# Patient Record
Sex: Male | Born: 1970
Health system: Southern US, Community
[De-identification: ages and names within clinical notes are randomized; demographics above are authoritative.]

## PROBLEM LIST (undated history)

## (undated) DIAGNOSIS — G4730 Sleep apnea, unspecified: Secondary | ICD-10-CM

## (undated) DIAGNOSIS — F419 Anxiety disorder, unspecified: Secondary | ICD-10-CM

## (undated) DIAGNOSIS — Z87442 Personal history of urinary calculi: Secondary | ICD-10-CM

## (undated) DIAGNOSIS — K219 Gastro-esophageal reflux disease without esophagitis: Secondary | ICD-10-CM

## (undated) DIAGNOSIS — C801 Malignant (primary) neoplasm, unspecified: Secondary | ICD-10-CM

## (undated) HISTORY — PX: WISDOM TOOTH EXTRACTION: SHX21

---

## 2016-10-23 DIAGNOSIS — E785 Hyperlipidemia, unspecified: Secondary | ICD-10-CM | POA: Diagnosis present

## 2018-10-05 ENCOUNTER — Other Ambulatory Visit: Payer: Self-pay

## 2018-10-05 ENCOUNTER — Emergency Department (HOSPITAL_BASED_OUTPATIENT_CLINIC_OR_DEPARTMENT_OTHER): Payer: 59

## 2018-10-05 ENCOUNTER — Encounter (HOSPITAL_BASED_OUTPATIENT_CLINIC_OR_DEPARTMENT_OTHER): Payer: Self-pay | Admitting: Emergency Medicine

## 2018-10-05 ENCOUNTER — Emergency Department (HOSPITAL_BASED_OUTPATIENT_CLINIC_OR_DEPARTMENT_OTHER)
Admission: EM | Admit: 2018-10-05 | Discharge: 2018-10-05 | Disposition: A | Discharge status: Home / Self Care | Payer: 59 | Attending: Emergency Medicine | Admitting: Emergency Medicine

## 2018-10-05 DIAGNOSIS — N201 Calculus of ureter: Secondary | ICD-10-CM | POA: Insufficient documentation

## 2018-10-05 DIAGNOSIS — R300 Dysuria: Secondary | ICD-10-CM | POA: Diagnosis present

## 2018-10-05 LAB — COMPREHENSIVE METABOLIC PANEL
ALT: 18 U/L (ref 0–44)
AST: 26 U/L (ref 15–41)
Albumin: 4.1 g/dL (ref 3.5–5.0)
Alkaline Phosphatase: 69 U/L (ref 38–126)
Anion gap: 7 (ref 5–15)
BUN: 16 mg/dL (ref 6–20)
CHLORIDE: 106 mmol/L (ref 98–111)
CO2: 25 mmol/L (ref 22–32)
Calcium: 8.9 mg/dL (ref 8.9–10.3)
Creatinine, Ser: 0.86 mg/dL (ref 0.61–1.24)
GFR calc Af Amer: >60 mL/min (ref >60)
GFR calc non Af Amer: >60 mL/min (ref >60)
Glucose, Bld: 119 mg/dL — ABNORMAL HIGH (ref 70–99)
Potassium: 3.2 mmol/L — ABNORMAL LOW (ref 3.5–5.1)
Sodium: 138 mmol/L (ref 135–145)
TOTAL PROTEIN: 7.1 g/dL (ref 6.5–8.1)
Total Bilirubin: 0.5 mg/dL (ref 0.3–1.2)

## 2018-10-05 LAB — CBC WITH DIFFERENTIAL/PLATELET
Abs Immature Granulocytes: 0.03 10*3/uL (ref 0.00–0.07)
BASOS ABS: 0 10*3/uL (ref 0.0–0.1)
Basophils Relative: 0 %
Eosinophils Absolute: 0.1 10*3/uL (ref 0.0–0.5)
Eosinophils Relative: 1 %
HCT: 44.4 % (ref 39.0–52.0)
Hemoglobin: 14 g/dL (ref 13.0–17.0)
Immature Granulocytes: 0 %
Lymphocytes Relative: 16 %
Lymphs Abs: 1.2 10*3/uL (ref 0.7–4.0)
MCH: 29.9 pg (ref 26.0–34.0)
MCHC: 31.5 g/dL (ref 30.0–36.0)
MCV: 94.7 fL (ref 80.0–100.0)
Monocytes Absolute: 0.9 10*3/uL (ref 0.1–1.0)
Monocytes Relative: 12 %
Neutro Abs: 5.1 10*3/uL (ref 1.7–7.7)
Neutrophils Relative %: 71 %
PLATELETS: 238 10*3/uL (ref 150–400)
RBC: 4.69 MIL/uL (ref 4.22–5.81)
RDW: 12.6 % (ref 11.5–15.5)
WBC: 7.4 10*3/uL (ref 4.0–10.5)
nRBC: 0 % (ref 0.0–0.2)

## 2018-10-05 LAB — URINALYSIS, ROUTINE W REFLEX MICROSCOPIC
Bilirubin Urine: NEGATIVE
Glucose, UA: NEGATIVE mg/dL
KETONES UR: NEGATIVE mg/dL
Leukocytes,Ua: NEGATIVE
Nitrite: NEGATIVE
PROTEIN: NEGATIVE mg/dL
Specific Gravity, Urine: 1.01 (ref 1.005–1.030)
pH: 5.5 (ref 5.0–8.0)

## 2018-10-05 LAB — URINALYSIS, MICROSCOPIC (REFLEX)

## 2018-10-05 MED ORDER — KETOROLAC TROMETHAMINE 10 MG PO TABS: 10.0000 mg | ORAL_TABLET | Freq: Four times a day (QID) | ORAL | 0 refills | Status: DC | PRN

## 2018-10-05 MED ORDER — KETOROLAC TROMETHAMINE 30 MG/ML IJ SOLN
30.0000 mg | Freq: Once | INTRAMUSCULAR | Status: AC
  Administered 2018-10-05: 30 mg via INTRAVENOUS
  Filled 2018-10-05: qty 1

## 2018-10-05 NOTE — ED Notes (Signed)
Patient transported to CT 

## 2018-10-05 NOTE — ED Notes (Signed)
ED Provider at bedside. 

## 2018-10-05 NOTE — Discharge Instructions (Signed)
You have been seen in the Emergency Department (ED) today for pain that we believe based on your workup, is caused by kidney stones.  As we have discussed, please drink plenty of fluids.  Please make a follow up appointment with the physician(s) listed elsewhere in this documentation. ° °You may take pain medication as needed but ONLY as prescribed.  Please also take your prescribed Flomax daily.  We also recommend that you take over-the-counter ibuprofen regularly according to label instructions over the next 5 days.  Take it with meals to minimize stomach discomfort. ° °Please see your doctor as soon as possible as stones may take 1-3 weeks to pass and you may require additional care or medications. ° °Do not drink alcohol, drive or participate in any other potentially dangerous activities while taking opiate pain medication as it may make you sleepy. Do not take this medication with any other sedating medications, either prescription or over-the-counter. If you were prescribed Percocet or Vicodin, do not take these with acetaminophen (Tylenol) as it is already contained within these medications. °  °Return to the Emergency Department (ED) or call your doctor if you have any worsening pain, fever, painful urination, are unable to urinate, or develop other symptoms that concern you. ° ° ° °Kidney Stones °Kidney stones (urolithiasis) are deposits that form inside your kidneys. The intense pain is caused by the stone moving through the urinary tract. When the stone moves, the ureter goes into spasm around the stone. The stone is usually passed in the urine.  °CAUSES  °A disorder that makes certain neck glands produce too much parathyroid hormone (primary hyperparathyroidism). °A buildup of uric acid crystals, similar to gout in your joints. °Narrowing (stricture) of the ureter. °A kidney obstruction present at birth (congenital obstruction). °Previous surgery on the kidney or ureters. °Numerous kidney  infections. °SYMPTOMS  °Feeling sick to your stomach (nauseous). °Throwing up (vomiting). °Blood in the urine (hematuria). °Pain that usually spreads (radiates) to the groin. °Frequency or urgency of urination. °DIAGNOSIS  °Taking a history and physical exam. °Blood or urine tests. °CT scan. °Occasionally, an examination of the inside of the urinary bladder (cystoscopy) is performed. °TREATMENT  °Observation. °Increasing your fluid intake. °Extracorporeal shock wave lithotripsy--This is a noninvasive procedure that uses shock waves to break up kidney stones. °Surgery may be needed if you have severe pain or persistent obstruction. There are various surgical procedures. Most of the procedures are performed with the use of small instruments. Only small incisions are needed to accommodate these instruments, so recovery time is minimized. °The size, location, and chemical composition are all important variables that will determine the proper choice of action for you. Talk to your health care provider to better understand your situation so that you will minimize the risk of injury to yourself and your kidney.  °HOME CARE INSTRUCTIONS  °Drink enough water and fluids to keep your urine clear or pale yellow. This will help you to pass the stone or stone fragments. °Strain all urine through the provided strainer. Keep all particulate matter and stones for your health care provider to see. The stone causing the pain may be as small as a grain of salt. It is very important to use the strainer each and every time you pass your urine. The collection of your stone will allow your health care provider to analyze it and verify that a stone has actually passed. The stone analysis will often identify what you can do to reduce the incidence of   recurrences. °Only take over-the-counter or prescription medicines for pain, discomfort, or fever as directed by your health care provider. °Keep all follow-up visits as told by your health care  provider. This is important. °Get follow-up X-rays if required. The absence of pain does not always mean that the stone has passed. It may have only stopped moving. If the urine remains completely obstructed, it can cause loss of kidney function or even complete destruction of the kidney. It is your responsibility to make sure X-rays and follow-ups are completed. Ultrasounds of the kidney can show blockages and the status of the kidney. Ultrasounds are not associated with any radiation and can be performed easily in a matter of minutes. °Make changes to your daily diet as told by your health care provider. You may be told to: °Limit the amount of salt that you eat. °Eat 5 or more servings of fruits and vegetables each day. °Limit the amount of meat, poultry, fish, and eggs that you eat. °Collect a 24-hour urine sample as told by your health care provider. You may need to collect another urine sample every 6-12 months. °SEEK MEDICAL CARE IF: °You experience pain that is progressive and unresponsive to any pain medicine you have been prescribed. °SEEK IMMEDIATE MEDICAL CARE IF:  °Pain cannot be controlled with the prescribed medicine. °You have a fever or shaking chills. °The severity or intensity of pain increases over 18 hours and is not relieved by pain medicine. °You develop a new onset of abdominal pain. °You feel faint or pass out. °You are unable to urinate. °  °This information is not intended to replace advice given to you by your health care provider. Make sure you discuss any questions you have with your health care provider. °  °Document Released: 07/23/2005 Document Revised: 04/13/2015 Document Reviewed: 12/24/2012 °Elsevier Interactive Patient Education ©2016 [employer] ° ° °

## 2018-10-05 NOTE — ED Provider Notes (Signed)
Emergency Department Provider Note   I have reviewed the triage vital signs and the nursing notes.   HISTORY  Chief Complaint Flank Pain   HPI Jimmy [name] is a 48 y.o. male with past medical history of kidney stones presents to the emergency department with dysuria, hematuria, intermittent flank pain for the past 4 weeks.  The patient has a remote history of kidney stone which did not require intervention.  He initially developed some blood in the urine with mild left flank pain 4 weeks ago.  He went to urgent care and was treated empirically for kidney stone.  He returned to his PCP who referred him to a urologist.  Patient states that the urologist performed an x-ray and ran a urine test which did not show blood or an obvious stone.  Over the past several days he is developed intermittent, severe left flank pain with continued hematuria.  No fevers or chills.  Patient reports some urine hesitancy with pain but then urinates normally when he is not having pain.   History reviewed. No pertinent past medical history.  There are no active problems to display for this patient.   Allergies Patient has no known allergies.  History reviewed. No pertinent family history.  Social History Social History   Tobacco Use  . Smoking status: Not on file  Substance Use Topics  . Alcohol use: Not on file  . Drug use: Not on file    Review of Systems  Constitutional: No fever/chills Eyes: No visual changes. ENT: No sore throat. Cardiovascular: Denies chest pain. Respiratory: Denies shortness of breath. Gastrointestinal: No abdominal pain.  No nausea, no vomiting.  No diarrhea.  No constipation. Positive left flank pain.  Genitourinary: Positive for dysuria and hematuria.  Musculoskeletal: Negative for back pain. Skin: Negative for rash. Neurological: Negative for headaches, focal weakness or numbness.  10-point ROS otherwise  negative.  ____________________________________________   PHYSICAL EXAM:  VITAL SIGNS: ED Triage Vitals  Enc Vitals Group     BP 10/05/18 1643 (!) 159/110     Pulse Rate 10/05/18 1643 93     Resp 10/05/18 1643 18     Temp 10/05/18 1643 97.8 F (36.6 C)     Temp Source 10/05/18 1643 Oral     SpO2 10/05/18 1643 97 %     Weight 10/05/18 1642 237 lb (107.5 kg)     Height 10/05/18 1642 6' 1.5" (1.867 m)    Constitutional: Alert and oriented. Well appearing and in no acute distress. Eyes: Conjunctivae are normal.  Head: Atraumatic. Nose: No congestion/rhinnorhea. Mouth/Throat: Mucous membranes are moist.   Neck: No stridor. Cardiovascular: Normal rate, regular rhythm. Good peripheral circulation. Grossly normal heart sounds.   Respiratory: Normal respiratory effort.  No retractions. Lungs CTAB. Gastrointestinal: Soft and nontender. No distention.  Musculoskeletal: No lower extremity tenderness nor edema. No gross deformities of extremities. Neurologic:  Normal speech and language. No gross focal neurologic deficits are appreciated.  Skin:  Skin is warm, dry and intact. No rash noted.  ____________________________________________   LABS (all labs ordered are listed, but only abnormal results are displayed)  Labs Reviewed  COMPREHENSIVE METABOLIC PANEL - Abnormal; Notable for the following components:      Result Value   Potassium 3.2 (*)    Glucose, Bld 119 (*)    All other components within normal limits  URINALYSIS, ROUTINE W REFLEX MICROSCOPIC - Abnormal; Notable for the following components:   Hgb urine dipstick MODERATE (*)  All other components within normal limits  URINALYSIS, MICROSCOPIC (REFLEX) - Abnormal; Notable for the following components:   Bacteria, UA RARE (*)    All other components within normal limits  URINE CULTURE  CBC WITH DIFFERENTIAL/PLATELET   ____________________________________________  RADIOLOGY  Ct Renal Stone Study  Result Date:  10/05/2018 CLINICAL DATA:  48 y/o  M; 1 month of left flank pain. EXAM: CT ABDOMEN AND PELVIS WITHOUT CONTRAST TECHNIQUE: Multidetector CT imaging of the abdomen and pelvis was performed following the standard protocol without IV contrast. COMPARISON:  None. FINDINGS: Lower chest: No acute abnormality. Hepatobiliary: Liver segment 4B subcentimeter lucency, likely cyst. Otherwise no focal liver abnormality is seen. No gallstones, gallbladder wall thickening, or biliary dilatation. Pancreas: Unremarkable. No pancreatic ductal dilatation or surrounding inflammatory changes. Spleen: Normal in size without focal abnormality. Adrenals/Urinary Tract: Normal adrenal glands. No focal kidney lesion identified. Right kidney upper pole 3 mm nonobstructing stone. Mild left hydronephrosis and perinephric stranding with a 3 mm stone at the left ureterovesicular junction and adjacent punctate stone (series 2, image 83). Stomach/Bowel: Stomach is within normal limits. Appendix appears normal. No evidence of bowel wall thickening, distention, or inflammatory changes. Vascular/Lymphatic: No significant vascular findings are present. No enlarged abdominal or pelvic lymph nodes. Reproductive: Prostate is unremarkable. Other: Small paraumbilical hernia containing fat. Musculoskeletal: No fracture is seen. Mild lumbar levocurvature with apex at L3. IMPRESSION: 1. Mild left hydronephrosis and perinephric stranding with a 3 mm stone at the left ureterovesicular junction and adjacent punctate stone. 2. Right kidney upper pole 3 mm nonobstructing stone. 3. Small paraumbilical hernia containing fat. Electronically Signed   By: Kristine Garbe M.D.   On: 10/05/2018 17:35    ____________________________________________   PROCEDURES  Procedure(s) performed:   Procedures  None  ____________________________________________   INITIAL IMPRESSION / ASSESSMENT AND PLAN / ED COURSE  Pertinent labs & imaging results that were  available during my care of the patient were reviewed by me and considered in my medical decision making (see chart for details).  Patient presents to the emergency department for evaluation of intermittent left flank pain with dysuria and hematuria.  Plan for CT renal for further evaluation and to rule out kidney stone.  Very low suspicion for vascular etiology of the patient's symptoms.  He does have some associated dysuria which may be related to his hematuria.  Will obtain urine analysis along with screening labs and send urine for culture.  CT reviewed.  Patient with 3 mm stone at the UVJ with hydronephrosis.  Urinalysis not concerning for infection developing urosepsis.  Patient's pain is well controlled.  He is not having pain at the fat-containing periumbilical hernia identified on CT.  Advised the patient start taking the Flomax which he was prescribed earlier.  Patient was also given Percocet which he can take for breakthrough symptoms.  I have added on Toradol and given the name for local urology for follow-up.  ____________________________________________  FINAL CLINICAL IMPRESSION(S) / ED DIAGNOSES  Final diagnoses:  Ureterolithiasis     MEDICATIONS GIVEN DURING THIS VISIT:  Medications  ketorolac (TORADOL) 30 MG/ML injection 30 mg (30 mg Intravenous Given 10/05/18 1831)     NEW OUTPATIENT MEDICATIONS STARTED DURING THIS VISIT:  Discharge Medication List as of 10/05/2018  6:19 PM    START taking these medications   Details  ketorolac (TORADOL) 10 MG tablet Take 1 tablet (10 mg total) by mouth every 6 (six) hours as needed., Starting Sun 10/05/2018, Print  Note:  This document was prepared using Dragon voice recognition software and may include unintentional dictation errors.  Nanda Quinton, MD Emergency Medicine    Schwanda Zima, [facility], MD 10/06/18 [phone]

## 2018-10-05 NOTE — ED Triage Notes (Signed)
Left flank pain x 1 month.  History of kidney stones.  Reports worsening pain today.  Denies nausea, vomiting.  C/o dysuria.

## 2018-10-07 LAB — URINE CULTURE: Culture: NO GROWTH

## 2019-09-10 ENCOUNTER — Other Ambulatory Visit: Payer: Self-pay | Admitting: Urology

## 2019-09-10 DIAGNOSIS — N201 Calculus of ureter: Secondary | ICD-10-CM

## 2019-09-15 NOTE — H&P (Signed)
HPI: Jimmy [name] is a 49 year-old male with a right ureteral stone.  The problem is on the right side.   Claris returns today in f/u for an 44mm stone on the right adjacent to L4 that was seen on a CT for hematuria on 1/27. He was initially seen for the hematuria on 08/11/19. A culture then was negative. A KUB today shows no progression for the stone. He has some dull left sided pain but no right flank pain. He has no further gross hematuria. He has some microhematuria and pyuria. He did have mild dysuria this morning. He has no history of UTI's.      ALLERGIES: None   MEDICATIONS: Amitriptyline Hcl 50 mg tablet  Sertraline Hcl 100 mg tablet  Testosterone Cypionate     GU PSH: Locm 300-399Mg /Ml Iodine,1Ml - 09/02/2019       PSH Notes: Encounter for contraceptive planning, Surgery Of Male Genitalia Vasectomy   NON-GU PSH: None   GU PMH: Gross hematuria, He has had intermittent gross hematuria with a history of stones and some left flank pain this week.. He had some bladder wall thickening on the CT in 3/20 and a family history of bladder cancer. I will get a CT hematuria study and consider cystoscopy if needed based on CT. He would like to use Nitrous. - 08/20/2019 Renal calculus, He had a 20mm RUP stone on the prior study. He never saw the left UVJ stone pass. - 08/20/2019 History of urolithiasis, Nephrolithiasis - 2014 Premature ejaculation, Premature ejaculation - 2014      PMH Notes:  1898-08-06 00:00:00 - Note: Normal Routine History And Physical Adult   NON-GU PMH: Bacteriuria, He has mild pyuria and bacteriuria on UA today. I will get a culture. - 08/20/2019 Anxiety Sleep Apnea    FAMILY HISTORY: Bladder Cancer - Father Family Health Status Number - Runs In Family lymphoma - Mother   SOCIAL HISTORY: Marital Status: Married Preferred Language: English; Race: White Current Smoking Status: Patient has never smoked.   Tobacco Use Assessment Completed: Used Tobacco in last 30  days? Drinks 1 drink per day.  Drinks 2 caffeinated drinks per day.     Notes: Caffeine Use, Alcohol Use, Marital History - Currently Married, Occupation:, Tobacco Use   REVIEW OF SYSTEMS:    GU Review Male:   Patient denies frequent urination, hard to postpone urination, burning/ pain with urination, get up at night to urinate, leakage of urine, stream starts and stops, trouble starting your stream, have to strain to urinate , erection problems, and penile pain.  Gastrointestinal (Upper):   Patient denies nausea, vomiting, and indigestion/ heartburn.  Gastrointestinal (Lower):   Patient denies diarrhea and constipation.  Constitutional:   Patient denies fever, night sweats, weight loss, and fatigue.  Skin:   Patient denies skin rash/ lesion and itching.  Eyes:   Patient denies blurred vision and double vision.  Ears/ Nose/ Throat:   Patient denies sore throat and sinus problems.  Hematologic/Lymphatic:   Patient denies swollen glands and easy bruising.  Cardiovascular:   Patient denies leg swelling and chest pains.  Respiratory:   Patient denies cough and shortness of breath.  Endocrine:   Patient denies excessive thirst.  Musculoskeletal:   Patient reports back pain. Patient denies joint pain.  Neurological:   Patient denies dizziness and headaches.  Psychologic:   Patient denies depression and anxiety.   VITAL SIGNS:    BP 143/90 mmHg  Heart Rate 73 /min  Temperature 98.4 F /  36.8 C   GU PHYSICAL EXAMINATION:    Anus and Perineum: No hemorrhoids. No anal stenosis. No rectal fissure, no anal fissure. No edema, no dimple, no perineal tenderness, no anal tenderness.  Scrotum: No lesions. No edema. No cysts. No warts.  Epididymides: Right: no spermatocele, no masses, no cysts, no tenderness, no induration, no enlargement. Left: no spermatocele, no masses, no cysts, no tenderness, no induration, no enlargement.  Testes: No tenderness, no swelling, no enlargement left testes. No  tenderness, no swelling, no enlargement right testes. Normal location left testes. Normal location right testes. No mass, no cyst, no varicocele, no hydrocele left testes. No mass, no cyst, no varicocele, no hydrocele right testes.  Urethral Meatus: Normal size. No lesion, no wart, no discharge, no polyp. Normal location.  Penis: Circumcised, no warts, no cracks. No dorsal Peyronie's plaques, no left corporal Peyronie's plaques, no right corporal Peyronie's plaques, no scarring, no warts. No balanitis, no meatal stenosis.  Prostate: 40 gram or 2+ size. Left lobe normal consistency, right lobe normal consistency. Symmetrical lobes. No prostate nodule. Left lobe no tenderness, right lobe no tenderness.  Seminal Vesicles: Nonpalpable.  Sphincter Tone: Normal sphincter. No rectal tenderness. No rectal mass.    MULTI-SYSTEM PHYSICAL EXAMINATION:    Constitutional: Well-nourished. No physical deformities. Normally developed. Good grooming.  Neck: Neck symmetrical, not swollen. Normal tracheal position.  Respiratory: No labored breathing, no use of accessory muscles.   Cardiovascular: Normal temperature, normal extremity pulses, no swelling, no varicosities.  Lymphatic: No enlargement of neck, axillae, groin.  Skin: No paleness, no jaundice, no cyanosis. No lesion, no ulcer, no rash.  Neurologic / Psychiatric: Oriented to time, oriented to place, oriented to person. No depression, no anxiety, no agitation.  Gastrointestinal: No mass, no tenderness, no rigidity, non obese abdomen.  Eyes: Normal conjunctivae. Normal eyelids.  Ears, Nose, Mouth, and Throat: Left ear no scars, no lesions, no masses. Right ear no scars, no lesions, no masses. Nose no scars, no lesions, no masses. Normal hearing. Normal lips.  Musculoskeletal: Normal gait and station of head and neck.    PAST DATA REVIEWED:  Source Of History:  Patient  Urine Test Review:   Urinalysis, Urine Culture  X-Ray Review: KUB: Reviewed Films.  Reviewed Report. Discussed With Patient.  C.T. Hematuria: Reviewed Films. Reviewed Report. Discussed With Patient.     04/08/07  PSA  Total PSA 0.39          Urinalysis w/Scope Dipstick Dipstick Cont'd Micro  Color: Yellow Bilirubin: Neg mg/dL WBC/hpf: 10 - 20/hpf  Appearance: Clear Ketones: Neg mg/dL RBC/hpf: 3 - 10/hpf  Specific Gravity: 1.025 Blood: Trace ery/uL Bacteria: Rare (0-9/hpf)  pH: 6.5 Protein: Trace mg/dL Cystals: NS (Not Seen)  Glucose: Neg mg/dL Urobilinogen: 0.2 mg/dL Casts: NS (Not Seen)    Nitrites: Neg Trichomonas: Not Present    Leukocyte Esterase: 1+ leu/uL Mucous: Present      Epithelial Cells: NS (Not Seen)      Yeast: NS (Not Seen)      Sperm: Not Present    ASSESSMENT/PLAN:     ICD-10 Details  1 GU:   Ureteral calculus - N20.1 Undiagnosed New Problem - He has an 60mm stone in the right lower proximal ureter that has not progressed since 1/27. I discussed options and will get him set up for ESWL. I reviewed the risks of ESWL including bleeding, infection, injury to the kidney or adjacent structures, failure to fragment the stone, need for ancillary procedures, thrombotic events, cardiac  arrhythmias and sedation complications.   2   Microscopic hematuria - 99991111 Acute, Uncomplicated - He had microhematuria and pyuria. A repeat urine culture was neg.

## 2019-09-17 ENCOUNTER — Encounter (HOSPITAL_BASED_OUTPATIENT_CLINIC_OR_DEPARTMENT_OTHER): Payer: Self-pay | Admitting: Urology

## 2019-09-17 ENCOUNTER — Encounter (HOSPITAL_BASED_OUTPATIENT_CLINIC_OR_DEPARTMENT_OTHER)
Admission: RE | Disposition: A | Discharge status: Home / Self Care | Payer: Self-pay | Source: Ambulatory Visit | Attending: Urology

## 2019-09-17 ENCOUNTER — Ambulatory Visit (HOSPITAL_COMMUNITY): Payer: 59

## 2019-09-17 ENCOUNTER — Ambulatory Visit (HOSPITAL_BASED_OUTPATIENT_CLINIC_OR_DEPARTMENT_OTHER)
Admission: RE | Admit: 2019-09-17 | Discharge: 2019-09-17 | Disposition: A | Discharge status: Home / Self Care | Payer: 59 | Source: Ambulatory Visit | Attending: Urology | Admitting: Urology

## 2019-09-17 DIAGNOSIS — N201 Calculus of ureter: Secondary | ICD-10-CM | POA: Diagnosis present

## 2019-09-17 DIAGNOSIS — R3129 Other microscopic hematuria: Secondary | ICD-10-CM | POA: Diagnosis not present

## 2019-09-17 HISTORY — PX: EXTRACORPOREAL SHOCK WAVE LITHOTRIPSY: SHX1557

## 2019-09-17 SURGERY — LITHOTRIPSY, ESWL
Anesthesia: LOCAL | Laterality: Right

## 2019-09-17 MED ORDER — TAMSULOSIN HCL 0.4 MG PO CAPS: 0.4000 mg | ORAL_CAPSULE | ORAL | 0 refills | Status: DC

## 2019-09-17 MED ORDER — DIAZEPAM 5 MG PO TABS
ORAL_TABLET | ORAL | Status: AC
  Filled 2019-09-17: qty 2

## 2019-09-17 MED ORDER — CIPROFLOXACIN HCL 500 MG PO TABS
500.0000 mg | ORAL_TABLET | ORAL | Status: AC
  Administered 2019-09-17: 500 mg via ORAL
  Filled 2019-09-17: qty 1

## 2019-09-17 MED ORDER — SODIUM CHLORIDE 0.9 % IV SOLN
INTRAVENOUS | Status: DC
  Filled 2019-09-17: qty 1000

## 2019-09-17 MED ORDER — DIAZEPAM 5 MG PO TABS
10.0000 mg | ORAL_TABLET | ORAL | Status: AC
  Administered 2019-09-17: 10 mg via ORAL
  Filled 2019-09-17: qty 2

## 2019-09-17 MED ORDER — DIPHENHYDRAMINE HCL 25 MG PO CAPS
25.0000 mg | ORAL_CAPSULE | ORAL | Status: AC
  Administered 2019-09-17: 25 mg via ORAL
  Filled 2019-09-17: qty 1

## 2019-09-17 MED ORDER — CIPROFLOXACIN HCL 500 MG PO TABS
ORAL_TABLET | ORAL | Status: AC
  Filled 2019-09-17: qty 1

## 2019-09-17 MED ORDER — DIPHENHYDRAMINE HCL 25 MG PO CAPS
ORAL_CAPSULE | ORAL | Status: AC
  Filled 2019-09-17: qty 1

## 2019-09-17 MED ORDER — HYDROCODONE-ACETAMINOPHEN 10-325 MG PO TABS: 1.0000 | ORAL_TABLET | ORAL | 0 refills | Status: DC | PRN

## 2019-09-17 NOTE — Op Note (Signed)
See [facility] OP note scanned into chart. Also because of the size, density, location and other factors that cannot be anticipated I feel this will likely be a staged procedure. This fact supersedes any indication in the scanned [facility] operative note to the contrary.  

## 2019-09-17 NOTE — Discharge Instructions (Signed)
Post Anesthesia Home Care Instructions  Activity: Get plenty of rest for the remainder of the day. A responsible adult should stay with you for 24 hours following the procedure.  For the next 24 hours, DO NOT: -Drive a car -Operate machinery -Drink alcoholic beverages -Take any medication unless instructed by your physician -Make any legal decisions or sign important papers.  Meals: Start with liquid foods such as gelatin or soup. Progress to regular foods as tolerated. Avoid greasy, spicy, heavy foods. If nausea and/or vomiting occur, drink only clear liquids until the nausea and/or vomiting subsides. Call your physician if vomiting continues.  Special Instructions/Symptoms: Your throat may feel dry or sore from the anesthesia or the breathing tube placed in your throat during surgery. If this causes discomfort, gargle with warm salt water. The discomfort should disappear within 24 hours.  If you had a scopolamine patch placed behind your ear for the management of post- operative nausea and/or vomiting:  1. The medication in the patch is effective for 72 hours, after which it should be removed.  Wrap patch in a tissue and discard in the trash. Wash hands thoroughly with soap and water. 2. You may remove the patch earlier than 72 hours if you experience unpleasant side effects which may include dry mouth, dizziness or visual disturbances. 3. Avoid touching the patch. Wash your hands with soap and water after contact with the patch.   Lithotripsy, Care After This sheet gives you information about how to care for yourself after your procedure. Your health care provider may also give you more specific instructions. If you have problems or questions, contact your health care provider. What can I expect after the procedure? After the procedure, it is common to have:  Some blood in your urine. This should only last for a few days.  Soreness in your back, sides, or upper abdomen for a few  days.  Blotches or bruises on your back where the pressure wave entered the skin.  Pain, discomfort, or nausea when pieces (fragments) of the kidney stone move through the tube that carries urine from the kidney to the bladder (ureter). Stone fragments may pass soon after the procedure, but they may continue to pass for up to 4-8 weeks. ? If you have severe pain or nausea, contact your health care provider. This may be caused by a large stone that was not broken up, and this may mean that you need more treatment.  Some pain or discomfort during urination.  Some pain or discomfort in the lower abdomen or (in men) at the base of the penis. Follow these instructions at home: Medicines  Take over-the-counter and prescription medicines only as told by your health care provider.  If you were prescribed an antibiotic medicine, take it as told by your health care provider. Do not stop taking the antibiotic even if you start to feel better.  Do not drive for 24 hours if you were given a medicine to help you relax (sedative).  Do not drive or use heavy machinery while taking prescription pain medicine. Eating and drinking      Drink enough water and fluids to keep your urine clear or pale yellow. This helps any remaining pieces of the stone to pass. It can also help prevent new stones from forming.  Eat plenty of fresh fruits and vegetables.  Follow instructions from your health care provider about eating and drinking restrictions. You may be instructed: ? To reduce how much salt (sodium) you eat   or drink. Check ingredients and nutrition facts on packaged foods and beverages. ? To reduce how much meat you eat.  Eat the recommended amount of calcium for your age and gender. Ask your health care provider how much calcium you should have. General instructions  Get plenty of rest.  Most people can resume normal activities 1-2 days after the procedure. Ask your health care provider what  activities are safe for you.  Your health care provider may direct you to lie in a certain position (postural drainage) and tap firmly (percuss) over your kidney area to help stone fragments pass. Follow instructions as told by your health care provider.  If directed, strain all urine through the strainer that was provided by your health care provider. ? Keep all fragments for your health care provider to see. Any stones that are found may be sent to a medical lab for examination. The stone may be as small as a grain of salt.  Keep all follow-up visits as told by your health care provider. This is important. Contact a health care provider if:  You have pain that is severe or does not get better with medicine.  You have nausea that is severe or does not go away.  You have blood in your urine longer than your health care provider told you to expect.  You have more blood in your urine.  You have pain during urination that does not go away.  You urinate more frequently than usual and this does not go away.  You develop a rash or any other possible signs of an allergic reaction. Get help right away if:  You have severe pain in your back, sides, or upper abdomen.  You have severe pain while urinating.  Your urine is very dark red.  You have blood in your stool (feces).  You cannot pass any urine at all.  You feel a strong urge to urinate after emptying your bladder.  You have a fever or chills.  You develop shortness of breath, difficulty breathing, or chest pain.  You have severe nausea that leads to persistent vomiting.  You faint. Summary  After this procedure, it is common to have some pain, discomfort, or nausea when pieces (fragments) of the kidney stone move through the tube that carries urine from the kidney to the bladder (ureter). If this pain or nausea is severe, however, you should contact your health care provider.  Most people can resume normal activities 1-2  days after the procedure. Ask your health care provider what activities are safe for you.  Drink enough water and fluids to keep your urine clear or pale yellow. This helps any remaining pieces of the stone to pass, and it can help prevent new stones from forming.  If directed, strain your urine and keep all fragments for your health care provider to see. Fragments or stones may be as small as a grain of salt.  Get help right away if you have severe pain in your back, sides, or upper abdomen or have severe pain while urinating. This information is not intended to replace advice given to you by your health care provider. Make sure you discuss any questions you have with your health care provider. Document Revised: 11/03/2018 Document Reviewed: 06/13/2016 Elsevier Patient Education  2020 [employer] Inc.  

## 2021-09-12 ENCOUNTER — Ambulatory Visit: Payer: Self-pay | Admitting: Surgery

## 2021-09-12 DIAGNOSIS — E66811 Obesity, class 1: Secondary | ICD-10-CM | POA: Diagnosis present

## 2021-09-12 DIAGNOSIS — R739 Hyperglycemia, unspecified: Secondary | ICD-10-CM

## 2021-09-12 DIAGNOSIS — K420 Umbilical hernia with obstruction, without gangrene: Secondary | ICD-10-CM | POA: Insufficient documentation

## 2021-09-12 DIAGNOSIS — E669 Obesity, unspecified: Secondary | ICD-10-CM | POA: Diagnosis present

## 2021-09-12 DIAGNOSIS — G4733 Obstructive sleep apnea (adult) (pediatric): Secondary | ICD-10-CM | POA: Insufficient documentation

## 2021-09-13 ENCOUNTER — Other Ambulatory Visit: Payer: Self-pay | Admitting: Surgery

## 2021-09-13 DIAGNOSIS — C187 Malignant neoplasm of sigmoid colon: Secondary | ICD-10-CM

## 2021-09-19 ENCOUNTER — Inpatient Hospital Stay: Admission: RE | Admit: 2021-09-19 | Payer: 59 | Source: Ambulatory Visit

## 2021-09-20 ENCOUNTER — Ambulatory Visit
Admission: RE | Admit: 2021-09-20 | Discharge: 2021-09-20 | Disposition: A | Discharge status: Home / Self Care | Payer: 59 | Source: Ambulatory Visit | Attending: Surgery | Admitting: Surgery

## 2021-09-20 ENCOUNTER — Other Ambulatory Visit: Payer: Self-pay

## 2021-09-20 DIAGNOSIS — C187 Malignant neoplasm of sigmoid colon: Secondary | ICD-10-CM

## 2021-09-20 MED ORDER — IOPAMIDOL (ISOVUE-300) INJECTION 61%
100.0000 mL | Freq: Once | INTRAVENOUS | Status: AC | PRN
  Administered 2021-09-20: 100 mL via INTRAVENOUS

## 2021-10-05 NOTE — Progress Notes (Addendum)
COVID swab appointment: 10/17/21 @ 1345 ? ?COVID Vaccine Completed: no ?Date COVID Vaccine completed: ?Has received booster: ?COVID vaccine manufacturer: [place]  ? ?Date of COVID positive in last 90 days: no ? ?PCP - Cecille Amsterdam, MD ?Cardiologist - n/a ? ?Chest x-ray - CT 09/20/21 Epic ?EKG - n/a ?Stress Test - 2018 ?ECHO - 2018 ?Cardiac Cath - n/a ?Pacemaker/ICD device last checked: n/a ?Spinal Cord Stimulator: n/a ? ?Bowel Prep - liquid diet day before, Miralax, Neomycin, Metronidazole, Ducolax, pt has supplies ? ?Sleep Study - yes, positive ?CPAP - yes, every night  ? ?Fasting Blood Sugar - n/a ?Checks Blood Sugar _____ times a day ? ?Blood Thinner Instructions: n/a ?Aspirin Instructions: ?Last Dose: ? ?Activity level: Can go up a flight of stairs and perform activities of daily living without stopping and without symptoms of chest pain or shortness of breath. ?   ?Anesthesia review:  ? ?Patient denies shortness of breath, fever, cough and chest pain at PAT appointment ? ? ?Patient verbalized understanding of instructions that were given to them at the PAT appointment. Patient was also instructed that they will need to review over the PAT instructions again at home before surgery.  ?

## 2021-10-05 NOTE — Patient Instructions (Addendum)
DUE TO COVID-19 ONLY ONE VISITOR IS ALLOWED TO COME WITH YOU AND STAY IN THE WAITING ROOM ONLY DURING PRE OP AND PROCEDURE.   **NO VISITORS ARE ALLOWED IN THE SHORT STAY AREA OR RECOVERY ROOM!!**  IF YOU WILL BE ADMITTED INTO THE HOSPITAL YOU ARE ALLOWED ONLY TWO SUPPORT PEOPLE DURING VISITATION HOURS ONLY (7 AM -8PM)   The support person(s) must pass our screening, gel in and out, and wear a mask at all times, including in the patients room. Patients must also wear a mask when staff or their support person are in the room. Visitors GUEST BADGE MUST BE WORN VISIBLY  One adult visitor may remain with you overnight and MUST be in the room by 8 P.M.  No visitors under the age of 56. Any visitor under the age of 73 must be accompanied by an adult.    COVID SWAB TESTING MUST BE COMPLETED ON:  10/17/21 @ 1:45 PM   Site: [facility] [place] Enter: Main Entrance have a seat in the waiting area to the right of main entrance (DO NOT [place]!!!!!) Dial: [phone] to alert staff you have arrived  You are not required to quarantine, however you are required to wear a well-fitted mask when you are out and around people not in your household.  Hand Hygiene often Do NOT share personal items Notify your provider if you are in close contact with someone who has COVID or you develop fever 100.4 or greater, new onset of sneezing, cough, sore throat, shortness of breath or body aches.       Your procedure is scheduled on: 10/19/21    Report to [facility] Main Entrance    Report to admitting at 8:45 AM   Call this number if you have problems the morning of surgery [phone]   Follow clear liquid diet the day before surgery   After Midnight you may have the following liquids until 8:00 AM DAY OF SURGERY  Water Black Coffee (sugar ok, NO MILK/CREAM OR CREAMERS)  Tea (sugar ok, NO MILK/CREAM OR CREAMERS) regular and decaf                              Plain Jell-O (NO RED)                                           Fruit ices (not with fruit pulp, NO RED)                                     Popsicles (NO RED)                                                                  Juice: apple, WHITE grape, WHITE cranberry Sports drinks like Gatorade (NO RED) Clear broth(vegetable,chicken,beef)               Drink 2 Ensure/G2 drinks AT 10:00 PM the night before surgery.  The day of surgery:  Drink ONE (1) Pre-Surgery Clear Ensure at 8:00 AM the morning of surgery. Drink in one sitting. Do not sip.  This drink was given to you during your hospital  pre-op appointment visit. Nothing else to drink after completing the  Pre-Surgery Clear Ensure.          If you have questions, please contact your surgeons office.   FOLLOW BOWEL PREP AND ANY ADDITIONAL PRE OP INSTRUCTIONS YOU RECEIVED FROM YOUR SURGEON'S OFFICE!!!     Oral Hygiene is also important to reduce your risk of infection.                                    Remember - BRUSH YOUR TEETH THE MORNING OF SURGERY WITH YOUR REGULAR TOOTHPASTE   Stop all vitamins and supplements 7 days before surgery.   Take these medicines the morning of surgery with A SIP OF WATER: Sertraline                              You may not have any metal on your body including jewelry, and body piercing             Do not wear lotions, powders, cologne, or deodorant              Men may shave face and neck.   Do not bring valuables to the hospital. [place].   Bring CPAP mask and tubing day of surgery.   Bring small overnight bag day of surgery.   Special Instructions: Bring a copy of your healthcare power of attorney and living will documents         the day of surgery if you haven't scanned them before.              Please read over the following fact sheets you were given: IF YOU HAVE QUESTIONS ABOUT YOUR PRE-OP INSTRUCTIONS PLEASE  CALL [facility] - Preparing for Surgery Before surgery, you can play an important role.  Because skin is not sterile, your skin needs to be as free of germs as possible.  You can reduce the number of germs on your skin by washing with CHG (chlorahexidine gluconate) soap before surgery.  CHG is an antiseptic cleaner which kills germs and bonds with the skin to continue killing germs even after washing. Please DO NOT use if you have an allergy to CHG or antibacterial soaps.  If your skin becomes reddened/irritated stop using the CHG and inform your nurse when you arrive at Short Stay. Do not shave (including legs and underarms) for at least 48 hours prior to the first CHG shower.  You may shave your face/neck.  Please follow these instructions carefully:  1.  Shower with CHG Soap the night before surgery and the  morning of surgery.  2.  If you choose to wash your hair, wash your hair first as usual with your normal  shampoo.  3.  After you shampoo, rinse your hair and body thoroughly to remove the shampoo.                             4.  Use CHG as you would any other liquid soap.  You can apply chg directly to the skin and wash.  Gently with a scrungie or clean washcloth.  5.  Apply the CHG Soap to your body ONLY FROM THE NECK DOWN.   Do   not use on face/ open                           Wound or open sores. Avoid contact with eyes, ears mouth and   genitals (private parts).                       Wash face,  Genitals (private parts) with your normal soap.             6.  Wash thoroughly, paying special attention to the area where your    surgery  will be performed.  7.  Thoroughly rinse your body with warm water from the neck down.  8.  DO NOT shower/wash with your normal soap after using and rinsing off the CHG Soap.                9.  Pat yourself dry with a clean towel.            10.  Wear clean pajamas.            11.  Place clean sheets on your bed the night of your  first shower and do not  sleep with pets. Day of Surgery : Do not apply any lotions/deodorants the morning of surgery.  Please wear clean clothes to the hospital/surgery center.  FAILURE TO FOLLOW THESE INSTRUCTIONS MAY RESULT IN THE CANCELLATION OF YOUR SURGERY  PATIENT SIGNATURE_________________________________  NURSE SIGNATURE__________________________________  ________________________________________________________________________   Adam Phenix  An incentive spirometer is a tool that can help keep your lungs clear and active. This tool measures how well you are filling your lungs with each breath. Taking long deep breaths may help reverse or decrease the chance of developing breathing (pulmonary) problems (especially infection) following: A long period of time when you are unable to move or be active. BEFORE THE PROCEDURE  If the spirometer includes an indicator to show your best effort, your nurse or respiratory therapist will set it to a desired goal. If possible, sit up straight or lean slightly forward. Try not to slouch. Hold the incentive spirometer in an upright position. INSTRUCTIONS FOR USE  Sit on the edge of your bed if possible, or sit up as far as you can in bed or on a chair. Hold the incentive spirometer in an upright position. Breathe out normally. Place the mouthpiece in your mouth and seal your lips tightly around it. Breathe in slowly and as deeply as possible, raising the piston or the ball toward the top of the column. Hold your breath for 3-5 seconds or for as long as possible. Allow the piston or ball to fall to the bottom of the column. Remove the mouthpiece from your mouth and breathe out normally. Rest for a few seconds and repeat Steps 1 through 7 at least 10 times every 1-2 hours when you are awake. Take your time and take a few normal breaths between deep breaths. The spirometer may include an indicator to show your best effort. Use the  indicator as a goal to work toward during each repetition. After each set of 10 deep breaths, practice coughing to be sure your lungs  are clear. If you have an incision (the cut made at the time of surgery), support your incision when coughing by placing a pillow or rolled up towels firmly against it. Once you are able to get out of bed, walk around indoors and cough well. You may stop using the incentive spirometer when instructed by your caregiver.  RISKS AND COMPLICATIONS Take your time so you do not get dizzy or light-headed. If you are in pain, you may need to take or ask for pain medication before doing incentive spirometry. It is harder to take a deep breath if you are having pain. AFTER USE Rest and breathe slowly and easily. It can be helpful to keep track of a log of your progress. Your caregiver can provide you with a simple table to help with this. If you are using the spirometer at home, follow these instructions: [place] IF:  You are having difficultly using the spirometer. You have trouble using the spirometer as often as instructed. Your pain medication is not giving enough relief while using the spirometer. You develop fever of 100.5 F (38.1 C) or higher. SEEK IMMEDIATE MEDICAL CARE IF:  You cough up bloody sputum that had not been present before. You develop fever of 102 F (38.9 C) or greater. You develop worsening pain at or near the incision site. MAKE SURE YOU:  Understand these instructions. Will watch your condition. Will get help right away if you are not doing well or get worse. Document Released: 12/03/2006 Document Revised: 10/15/2011 Document Reviewed: 02/03/2007 ExitCare Patient Information 2014 Exit[employer], [place].   ________________________________________________________________________  WHAT IS A BLOOD TRANSFUSION? Blood Transfusion Information  A transfusion is the replacement of blood or some of its parts. Blood is made up of multiple cells  which provide different functions. Red blood cells carry oxygen and are used for blood loss replacement. White blood cells fight against infection. Platelets control bleeding. Plasma helps clot blood. Other blood products are available for specialized needs, such as hemophilia or other clotting disorders. BEFORE THE TRANSFUSION  Who gives blood for transfusions?  Healthy volunteers who are fully evaluated to make sure their blood is safe. This is blood bank blood. Transfusion therapy is the safest it has ever been in the practice of medicine. Before blood is taken from a donor, a complete history is taken to make sure that person has no history of diseases nor engages in risky social behavior (examples are intravenous drug use or sexual activity with multiple partners). The donor's travel history is screened to minimize risk of transmitting infections, such as malaria. The donated blood is tested for signs of infectious diseases, such as HIV and hepatitis. The blood is then tested to be sure it is compatible with you in order to minimize the chance of a transfusion reaction. If you or a relative donates blood, this is often done in anticipation of surgery and is not appropriate for emergency situations. It takes many days to process the donated blood. RISKS AND COMPLICATIONS Although transfusion therapy is very safe and saves many lives, the main dangers of transfusion include:  Getting an infectious disease. Developing a transfusion reaction. This is an allergic reaction to something in the blood you were given. Every precaution is taken to prevent this. The decision to have a blood transfusion has been considered carefully by your caregiver before blood is given. Blood is not given unless the benefits outweigh the risks. AFTER THE TRANSFUSION Right after receiving a blood transfusion, you will  usually feel much better and more energetic. This is especially true if your red blood cells have gotten  low (anemic). The transfusion raises the level of the red blood cells which carry oxygen, and this usually causes an energy increase. The nurse administering the transfusion will monitor you carefully for complications. HOME CARE INSTRUCTIONS  No special instructions are needed after a transfusion. You may find your energy is better. Speak with your caregiver about any limitations on activity for underlying diseases you may have. SEEK MEDICAL CARE IF:  Your condition is not improving after your transfusion. You develop redness or irritation at the intravenous (IV) site. SEEK IMMEDIATE MEDICAL CARE IF:  Any of the following symptoms occur over the next 12 hours: Shaking chills. You have a temperature by mouth above 102 F (38.9 C), not controlled by medicine. Chest, back, or muscle pain. People around you feel you are not acting correctly or are confused. Shortness of breath or difficulty breathing. Dizziness and fainting. You get a rash or develop hives. You have a decrease in urine output. Your urine turns a dark color or changes to pink, red, or brown. Any of the following symptoms occur over the next 10 days: You have a temperature by mouth above 102 F (38.9 C), not controlled by medicine. Shortness of breath. Weakness after normal activity. The white part of the eye turns yellow (jaundice). You have a decrease in the amount of urine or are urinating less often. Your urine turns a dark color or changes to pink, red, or brown. Document Released: 07/20/2000 Document Revised: 10/15/2011 Document Reviewed: 03/08/2008 [facility] Patient Information 2014 [place], [place].  _______________________________________________________________________

## 2021-10-06 ENCOUNTER — Encounter (HOSPITAL_COMMUNITY)
Admission: RE | Admit: 2021-10-06 | Discharge: 2021-10-06 | Disposition: A | Discharge status: Home / Self Care | Payer: 59 | Source: Ambulatory Visit | Attending: Surgery | Admitting: Surgery

## 2021-10-06 ENCOUNTER — Encounter (HOSPITAL_COMMUNITY): Payer: Self-pay

## 2021-10-06 ENCOUNTER — Other Ambulatory Visit: Payer: Self-pay

## 2021-10-06 VITALS — BP 134/86 | HR 70 | Temp 98.0°F | Resp 14 | Ht 74.0 in | Wt 233.0 lb

## 2021-10-06 DIAGNOSIS — R739 Hyperglycemia, unspecified: Secondary | ICD-10-CM | POA: Insufficient documentation

## 2021-10-06 DIAGNOSIS — Z01818 Encounter for other preprocedural examination: Secondary | ICD-10-CM

## 2021-10-06 DIAGNOSIS — Z01812 Encounter for preprocedural laboratory examination: Secondary | ICD-10-CM | POA: Diagnosis present

## 2021-10-06 HISTORY — DX: Anxiety disorder, unspecified: F41.9

## 2021-10-06 HISTORY — DX: Sleep apnea, unspecified: G47.30

## 2021-10-06 HISTORY — DX: Personal history of urinary calculi: Z87.442

## 2021-10-06 HISTORY — DX: Gastro-esophageal reflux disease without esophagitis: K21.9

## 2021-10-06 LAB — CBC
HCT: 47.5 % (ref 39.0–52.0)
Hemoglobin: 15.4 g/dL (ref 13.0–17.0)
MCH: 30.4 pg (ref 26.0–34.0)
MCHC: 32.4 g/dL (ref 30.0–36.0)
MCV: 93.9 fL (ref 80.0–100.0)
Platelets: 275 10*3/uL (ref 150–400)
RBC: 5.06 MIL/uL (ref 4.22–5.81)
RDW: 12.9 % (ref 11.5–15.5)
WBC: 4.9 10*3/uL (ref 4.0–10.5)
nRBC: 0 % (ref 0.0–0.2)

## 2021-10-06 LAB — TYPE AND SCREEN
ABO/RH(D): O POS
Antibody Screen: NEGATIVE

## 2021-10-06 LAB — HEMOGLOBIN A1C
Hgb A1c MFr Bld: 5.5 % (ref 4.8–5.6)
Mean Plasma Glucose: 111.15 mg/dL

## 2021-10-17 ENCOUNTER — Encounter (HOSPITAL_COMMUNITY)
Admission: RE | Admit: 2021-10-17 | Discharge: 2021-10-17 | Disposition: A | Discharge status: Home / Self Care | Payer: 59 | Source: Ambulatory Visit | Attending: Surgery | Admitting: Surgery

## 2021-10-17 ENCOUNTER — Other Ambulatory Visit: Payer: Self-pay

## 2021-10-17 DIAGNOSIS — Z20822 Contact with and (suspected) exposure to covid-19: Secondary | ICD-10-CM | POA: Insufficient documentation

## 2021-10-17 DIAGNOSIS — Z01812 Encounter for preprocedural laboratory examination: Secondary | ICD-10-CM | POA: Insufficient documentation

## 2021-10-17 DIAGNOSIS — Z01818 Encounter for other preprocedural examination: Secondary | ICD-10-CM

## 2021-10-17 LAB — SARS CORONAVIRUS 2 (TAT 6-24 HRS): SARS Coronavirus 2: NEGATIVE

## 2021-10-19 ENCOUNTER — Inpatient Hospital Stay (HOSPITAL_COMMUNITY)
Admission: RE | Admit: 2021-10-19 | Discharge: 2021-10-22 | DRG: 330 | Disposition: A | Discharge status: Home / Self Care | Payer: 59 | Source: Ambulatory Visit | Attending: Surgery | Admitting: Surgery

## 2021-10-19 ENCOUNTER — Other Ambulatory Visit: Payer: Self-pay

## 2021-10-19 ENCOUNTER — Encounter (HOSPITAL_COMMUNITY): Payer: Self-pay | Admitting: Surgery

## 2021-10-19 ENCOUNTER — Inpatient Hospital Stay (HOSPITAL_COMMUNITY): Payer: 59 | Admitting: Anesthesiology

## 2021-10-19 ENCOUNTER — Encounter (HOSPITAL_COMMUNITY)
Admission: RE | Disposition: A | Discharge status: Home / Self Care | Payer: Self-pay | Source: Ambulatory Visit | Attending: Surgery

## 2021-10-19 DIAGNOSIS — E876 Hypokalemia: Secondary | ICD-10-CM | POA: Diagnosis present

## 2021-10-19 DIAGNOSIS — E785 Hyperlipidemia, unspecified: Secondary | ICD-10-CM | POA: Diagnosis present

## 2021-10-19 DIAGNOSIS — C187 Malignant neoplasm of sigmoid colon: Secondary | ICD-10-CM

## 2021-10-19 DIAGNOSIS — Z79899 Other long term (current) drug therapy: Secondary | ICD-10-CM

## 2021-10-19 DIAGNOSIS — Z6830 Body mass index (BMI) 30.0-30.9, adult: Secondary | ICD-10-CM | POA: Diagnosis not present

## 2021-10-19 DIAGNOSIS — Z20822 Contact with and (suspected) exposure to covid-19: Secondary | ICD-10-CM | POA: Diagnosis present

## 2021-10-19 DIAGNOSIS — K420 Umbilical hernia with obstruction, without gangrene: Secondary | ICD-10-CM | POA: Diagnosis present

## 2021-10-19 DIAGNOSIS — Z79890 Hormone replacement therapy (postmenopausal): Secondary | ICD-10-CM

## 2021-10-19 DIAGNOSIS — R1310 Dysphagia, unspecified: Secondary | ICD-10-CM | POA: Diagnosis present

## 2021-10-19 DIAGNOSIS — Z87442 Personal history of urinary calculi: Secondary | ICD-10-CM | POA: Diagnosis not present

## 2021-10-19 DIAGNOSIS — G4730 Sleep apnea, unspecified: Secondary | ICD-10-CM | POA: Diagnosis present

## 2021-10-19 DIAGNOSIS — E669 Obesity, unspecified: Secondary | ICD-10-CM | POA: Diagnosis present

## 2021-10-19 DIAGNOSIS — Z8052 Family history of malignant neoplasm of bladder: Secondary | ICD-10-CM | POA: Diagnosis not present

## 2021-10-19 DIAGNOSIS — K219 Gastro-esophageal reflux disease without esophagitis: Secondary | ICD-10-CM | POA: Diagnosis present

## 2021-10-19 DIAGNOSIS — F419 Anxiety disorder, unspecified: Secondary | ICD-10-CM | POA: Diagnosis present

## 2021-10-19 HISTORY — PX: PROCTOSCOPY: SHX2266

## 2021-10-19 LAB — ABO/RH: ABO/RH(D): O POS

## 2021-10-19 SURGERY — COLECTOMY, SIGMOID, ROBOT-ASSISTED
Anesthesia: General

## 2021-10-19 MED ORDER — KETOROLAC TROMETHAMINE 15 MG/ML IJ SOLN: 15.0000 mg | Freq: Once | INTRAMUSCULAR | Status: DC

## 2021-10-19 MED ORDER — LIDOCAINE HCL (CARDIAC) PF 100 MG/5ML IV SOSY
PREFILLED_SYRINGE | INTRAVENOUS | Status: DC | PRN
  Administered 2021-10-19: 100 mg via INTRAVENOUS

## 2021-10-19 MED ORDER — SERTRALINE HCL 100 MG PO TABS
100.0000 mg | ORAL_TABLET | Freq: Every evening | ORAL | Status: DC
  Administered 2021-10-20 – 2021-10-21 (×2): 100 mg via ORAL
  Filled 2021-10-19 (×2): qty 1

## 2021-10-19 MED ORDER — AMITRIPTYLINE HCL 50 MG PO TABS
50.0000 mg | ORAL_TABLET | Freq: Every day | ORAL | Status: DC
  Administered 2021-10-19 – 2021-10-21 (×3): 50 mg via ORAL
  Filled 2021-10-19 (×3): qty 1

## 2021-10-19 MED ORDER — EPHEDRINE 5 MG/ML INJ
INTRAVENOUS | Status: AC
  Filled 2021-10-19: qty 5

## 2021-10-19 MED ORDER — SODIUM CHLORIDE (PF) 0.9 % IJ SOLN
INTRAMUSCULAR | Status: AC
  Filled 2021-10-19: qty 10

## 2021-10-19 MED ORDER — SODIUM CHLORIDE 0.9 % IV SOLN
2.0000 g | INTRAVENOUS | Status: AC
  Administered 2021-10-19: 2 g via INTRAVENOUS
  Filled 2021-10-19: qty 2

## 2021-10-19 MED ORDER — INDOCYANINE GREEN 25 MG IV SOLR
INTRAVENOUS | Status: DC | PRN
  Administered 2021-10-19: 7.5 mg via INTRAVENOUS
  Administered 2021-10-19: 10 mg via INTRAVENOUS
  Administered 2021-10-19: 7.5 mg via INTRAVENOUS

## 2021-10-19 MED ORDER — MIDAZOLAM HCL 2 MG/2ML IJ SOLN
INTRAMUSCULAR | Status: AC
Start: 2021-10-19 — End: ?
  Filled 2021-10-19: qty 2

## 2021-10-19 MED ORDER — METOPROLOL TARTRATE 5 MG/5ML IV SOLN: 5.0000 mg | Freq: Four times a day (QID) | INTRAVENOUS | Status: DC | PRN

## 2021-10-19 MED ORDER — ROCURONIUM BROMIDE 10 MG/ML (PF) SYRINGE
PREFILLED_SYRINGE | INTRAVENOUS | Status: DC | PRN
Start: 2021-10-19 — End: 2021-10-19
  Administered 2021-10-19: 10 mg via INTRAVENOUS
  Administered 2021-10-19: 40 mg via INTRAVENOUS
  Administered 2021-10-19: 60 mg via INTRAVENOUS

## 2021-10-19 MED ORDER — ONDANSETRON HCL 4 MG/2ML IJ SOLN: 4.0000 mg | Freq: Once | INTRAMUSCULAR | Status: DC | PRN

## 2021-10-19 MED ORDER — ORAL CARE MOUTH RINSE: 15.0000 mL | Freq: Once | OROMUCOSAL | Status: AC

## 2021-10-19 MED ORDER — BISACODYL 5 MG PO TBEC: 20.0000 mg | DELAYED_RELEASE_TABLET | Freq: Once | ORAL | Status: DC

## 2021-10-19 MED ORDER — MEPERIDINE HCL 50 MG/ML IJ SOLN: 6.2500 mg | INTRAMUSCULAR | Status: DC | PRN

## 2021-10-19 MED ORDER — LACTATED RINGERS IV SOLN: INTRAVENOUS | Status: DC

## 2021-10-19 MED ORDER — HYDRALAZINE HCL 20 MG/ML IJ SOLN: 10.0000 mg | INTRAMUSCULAR | Status: DC | PRN

## 2021-10-19 MED ORDER — BUPIVACAINE-EPINEPHRINE (PF) 0.25% -1:200000 IJ SOLN
INTRAMUSCULAR | Status: DC | PRN
  Administered 2021-10-19: 60 mL

## 2021-10-19 MED ORDER — ENSURE PRE-SURGERY PO LIQD
592.0000 mL | Freq: Once | ORAL | Status: DC
  Filled 2021-10-19: qty 592

## 2021-10-19 MED ORDER — 0.9 % SODIUM CHLORIDE (POUR BTL) OPTIME
TOPICAL | Status: DC | PRN
  Administered 2021-10-19: 2000 mL

## 2021-10-19 MED ORDER — HYDROMORPHONE HCL 1 MG/ML IJ SOLN: 0.5000 mg | INTRAMUSCULAR | Status: DC | PRN

## 2021-10-19 MED ORDER — PHENYLEPHRINE 40 MCG/ML (10ML) SYRINGE FOR IV PUSH (FOR BLOOD PRESSURE SUPPORT)
PREFILLED_SYRINGE | INTRAVENOUS | Status: AC
  Filled 2021-10-19: qty 10

## 2021-10-19 MED ORDER — LIDOCAINE 2% (20 MG/ML) 5 ML SYRINGE
INTRAMUSCULAR | Status: DC | PRN
  Administered 2021-10-19: 1.5 mg/kg/h via INTRAVENOUS

## 2021-10-19 MED ORDER — ENSURE SURGERY PO LIQD
237.0000 mL | Freq: Two times a day (BID) | ORAL | Status: DC
  Administered 2021-10-20 – 2021-10-22 (×4): 237 mL via ORAL

## 2021-10-19 MED ORDER — METRONIDAZOLE 500 MG PO TABS
1000.0000 mg | ORAL_TABLET | ORAL | Status: DC
Start: 2021-10-19 — End: 2021-10-19

## 2021-10-19 MED ORDER — ENALAPRILAT 1.25 MG/ML IV SOLN
0.6250 mg | Freq: Four times a day (QID) | INTRAVENOUS | Status: DC | PRN
  Filled 2021-10-19: qty 1

## 2021-10-19 MED ORDER — CHLORHEXIDINE GLUCONATE 0.12 % MT SOLN
15.0000 mL | Freq: Once | OROMUCOSAL | Status: AC
  Administered 2021-10-19: 15 mL via OROMUCOSAL

## 2021-10-19 MED ORDER — SIMETHICONE 80 MG PO CHEW: 40.0000 mg | CHEWABLE_TABLET | Freq: Four times a day (QID) | ORAL | Status: DC | PRN

## 2021-10-19 MED ORDER — TESTOSTERONE CYPIONATE 200 MG/ML IM SOLN: 100.0000 mg | INTRAMUSCULAR | Status: DC

## 2021-10-19 MED ORDER — FENTANYL CITRATE (PF) 100 MCG/2ML IJ SOLN
INTRAMUSCULAR | Status: AC
Start: 2021-10-19 — End: ?
  Filled 2021-10-19: qty 2

## 2021-10-19 MED ORDER — DEXAMETHASONE SODIUM PHOSPHATE 10 MG/ML IJ SOLN
INTRAMUSCULAR | Status: DC | PRN
  Administered 2021-10-19: 10 mg via INTRAVENOUS

## 2021-10-19 MED ORDER — LIDOCAINE HCL (PF) 2 % IJ SOLN
INTRAMUSCULAR | Status: AC
  Filled 2021-10-19: qty 5

## 2021-10-19 MED ORDER — NEOMYCIN SULFATE 500 MG PO TABS: 1000.0000 mg | ORAL_TABLET | ORAL | Status: DC

## 2021-10-19 MED ORDER — TRAMADOL HCL 50 MG PO TABS: 50.0000 mg | ORAL_TABLET | Freq: Four times a day (QID) | ORAL | 0 refills | Status: DC | PRN

## 2021-10-19 MED ORDER — PROCHLORPERAZINE EDISYLATE 10 MG/2ML IJ SOLN: 5.0000 mg | Freq: Four times a day (QID) | INTRAMUSCULAR | Status: DC | PRN

## 2021-10-19 MED ORDER — ONDANSETRON HCL 4 MG PO TABS: 4.0000 mg | ORAL_TABLET | Freq: Four times a day (QID) | ORAL | Status: DC | PRN

## 2021-10-19 MED ORDER — PROPOFOL 10 MG/ML IV BOLUS
INTRAVENOUS | Status: AC
Start: 2021-10-19 — End: ?
  Filled 2021-10-19: qty 20

## 2021-10-19 MED ORDER — LIP MEDEX EX OINT
1.0000 "application " | TOPICAL_OINTMENT | Freq: Two times a day (BID) | CUTANEOUS | Status: DC
  Administered 2021-10-19 – 2021-10-22 (×6): 1 via TOPICAL
  Filled 2021-10-19: qty 7

## 2021-10-19 MED ORDER — FENTANYL CITRATE (PF) 100 MCG/2ML IJ SOLN
INTRAMUSCULAR | Status: AC
  Filled 2021-10-19: qty 2

## 2021-10-19 MED ORDER — PROPOFOL 10 MG/ML IV BOLUS
INTRAVENOUS | Status: DC | PRN
  Administered 2021-10-19: 200 mg via INTRAVENOUS

## 2021-10-19 MED ORDER — SODIUM CHLORIDE 0.9 % IV SOLN
2.0000 g | Freq: Two times a day (BID) | INTRAVENOUS | Status: AC
  Administered 2021-10-20: 2 g via INTRAVENOUS
  Filled 2021-10-19: qty 2

## 2021-10-19 MED ORDER — ONDANSETRON HCL 4 MG/2ML IJ SOLN
INTRAMUSCULAR | Status: AC
  Filled 2021-10-19: qty 2

## 2021-10-19 MED ORDER — KETAMINE HCL 50 MG/5ML IJ SOSY
PREFILLED_SYRINGE | INTRAMUSCULAR | Status: AC
  Filled 2021-10-19: qty 5

## 2021-10-19 MED ORDER — MELATONIN 3 MG PO TABS: 3.0000 mg | ORAL_TABLET | Freq: Every evening | ORAL | Status: DC | PRN

## 2021-10-19 MED ORDER — ROCURONIUM BROMIDE 10 MG/ML (PF) SYRINGE
PREFILLED_SYRINGE | INTRAVENOUS | Status: AC
  Filled 2021-10-19: qty 10

## 2021-10-19 MED ORDER — BUPIVACAINE-EPINEPHRINE (PF) 0.25% -1:200000 IJ SOLN
INTRAMUSCULAR | Status: AC
  Filled 2021-10-19: qty 60

## 2021-10-19 MED ORDER — TRAMADOL HCL 50 MG PO TABS
50.0000 mg | ORAL_TABLET | Freq: Four times a day (QID) | ORAL | Status: DC | PRN
  Administered 2021-10-19 – 2021-10-21 (×3): 100 mg via ORAL
  Filled 2021-10-19 (×4): qty 2

## 2021-10-19 MED ORDER — ACETAMINOPHEN 500 MG PO TABS
1000.0000 mg | ORAL_TABLET | Freq: Four times a day (QID) | ORAL | Status: DC
  Administered 2021-10-19 – 2021-10-22 (×10): 1000 mg via ORAL
  Filled 2021-10-19 (×10): qty 2

## 2021-10-19 MED ORDER — BUPIVACAINE LIPOSOME 1.3 % IJ SUSP
INTRAMUSCULAR | Status: DC | PRN
  Administered 2021-10-19: 20 mL

## 2021-10-19 MED ORDER — BUPIVACAINE LIPOSOME 1.3 % IJ SUSP
INTRAMUSCULAR | Status: AC
  Filled 2021-10-19: qty 20

## 2021-10-19 MED ORDER — ALUM & MAG HYDROXIDE-SIMETH 200-200-20 MG/5ML PO SUSP: 30.0000 mL | Freq: Four times a day (QID) | ORAL | Status: DC | PRN

## 2021-10-19 MED ORDER — METHOCARBAMOL 1000 MG/10ML IJ SOLN
1000.0000 mg | Freq: Four times a day (QID) | INTRAMUSCULAR | Status: DC | PRN
  Filled 2021-10-19: qty 10

## 2021-10-19 MED ORDER — ENOXAPARIN SODIUM 40 MG/0.4ML IJ SOSY
40.0000 mg | PREFILLED_SYRINGE | INTRAMUSCULAR | Status: DC
  Administered 2021-10-20 – 2021-10-22 (×3): 40 mg via SUBCUTANEOUS
  Filled 2021-10-19 (×3): qty 0.4

## 2021-10-19 MED ORDER — ACETAMINOPHEN 500 MG PO TABS
1000.0000 mg | ORAL_TABLET | ORAL | Status: AC
  Administered 2021-10-19: 1000 mg via ORAL
  Filled 2021-10-19: qty 2

## 2021-10-19 MED ORDER — PHENYLEPHRINE 40 MCG/ML (10ML) SYRINGE FOR IV PUSH (FOR BLOOD PRESSURE SUPPORT)
PREFILLED_SYRINGE | INTRAVENOUS | Status: DC | PRN
  Administered 2021-10-19: 80 ug via INTRAVENOUS

## 2021-10-19 MED ORDER — MAGIC MOUTHWASH
15.0000 mL | Freq: Four times a day (QID) | ORAL | Status: DC | PRN
  Filled 2021-10-19: qty 15

## 2021-10-19 MED ORDER — SUGAMMADEX SODIUM 200 MG/2ML IV SOLN
INTRAVENOUS | Status: DC | PRN
  Administered 2021-10-19: 200 mg via INTRAVENOUS

## 2021-10-19 MED ORDER — ALVIMOPAN 12 MG PO CAPS
12.0000 mg | ORAL_CAPSULE | Freq: Two times a day (BID) | ORAL | Status: DC
  Administered 2021-10-21: 12 mg via ORAL
  Filled 2021-10-19 (×3): qty 1

## 2021-10-19 MED ORDER — POLYETHYLENE GLYCOL 3350 17 GM/SCOOP PO POWD: 1.0000 | Freq: Once | ORAL | Status: DC

## 2021-10-19 MED ORDER — PROPOFOL 10 MG/ML IV BOLUS
INTRAVENOUS | Status: AC
  Filled 2021-10-19: qty 20

## 2021-10-19 MED ORDER — GABAPENTIN 300 MG PO CAPS
300.0000 mg | ORAL_CAPSULE | ORAL | Status: AC
  Administered 2021-10-19: 300 mg via ORAL
  Filled 2021-10-19: qty 1

## 2021-10-19 MED ORDER — CELECOXIB 200 MG PO CAPS
200.0000 mg | ORAL_CAPSULE | ORAL | Status: AC
  Administered 2021-10-19: 200 mg via ORAL
  Filled 2021-10-19: qty 1

## 2021-10-19 MED ORDER — DIPHENHYDRAMINE HCL 12.5 MG/5ML PO ELIX
12.5000 mg | ORAL_SOLUTION | Freq: Four times a day (QID) | ORAL | Status: DC | PRN
Start: 2021-10-19 — End: 2021-10-22

## 2021-10-19 MED ORDER — ACETAMINOPHEN 10 MG/ML IV SOLN: 1000.0000 mg | Freq: Once | INTRAVENOUS | Status: DC | PRN

## 2021-10-19 MED ORDER — KETAMINE HCL 10 MG/ML IJ SOLN
INTRAMUSCULAR | Status: DC | PRN
  Administered 2021-10-19: 20 mg via INTRAVENOUS
  Administered 2021-10-19: 30 mg via INTRAVENOUS

## 2021-10-19 MED ORDER — BUPIVACAINE LIPOSOME 1.3 % IJ SUSP: 20.0000 mL | Freq: Once | INTRAMUSCULAR | Status: DC

## 2021-10-19 MED ORDER — DEXAMETHASONE SODIUM PHOSPHATE 10 MG/ML IJ SOLN
INTRAMUSCULAR | Status: AC
  Filled 2021-10-19: qty 1

## 2021-10-19 MED ORDER — LACTATED RINGERS IR SOLN
Status: DC | PRN
  Administered 2021-10-19: 1000 mL

## 2021-10-19 MED ORDER — FENTANYL CITRATE (PF) 100 MCG/2ML IJ SOLN
INTRAMUSCULAR | Status: DC | PRN
  Administered 2021-10-19 (×6): 50 ug via INTRAVENOUS

## 2021-10-19 MED ORDER — ALVIMOPAN 12 MG PO CAPS
12.0000 mg | ORAL_CAPSULE | ORAL | Status: AC
  Administered 2021-10-19: 12 mg via ORAL
  Filled 2021-10-19: qty 1

## 2021-10-19 MED ORDER — LIDOCAINE HCL 2 % IJ SOLN
INTRAMUSCULAR | Status: AC
  Filled 2021-10-19: qty 20

## 2021-10-19 MED ORDER — EPHEDRINE SULFATE-NACL 50-0.9 MG/10ML-% IV SOSY
PREFILLED_SYRINGE | INTRAVENOUS | Status: DC | PRN
  Administered 2021-10-19 (×2): 5 mg via INTRAVENOUS

## 2021-10-19 MED ORDER — MIDAZOLAM HCL 5 MG/5ML IJ SOLN
INTRAMUSCULAR | Status: DC | PRN
  Administered 2021-10-19: 2 mg via INTRAVENOUS

## 2021-10-19 MED ORDER — LACTATED RINGERS IV SOLN: INTRAVENOUS | Status: DC | PRN

## 2021-10-19 MED ORDER — HYDROMORPHONE HCL 1 MG/ML IJ SOLN: 0.2500 mg | INTRAMUSCULAR | Status: DC | PRN

## 2021-10-19 MED ORDER — DIPHENHYDRAMINE HCL 50 MG/ML IJ SOLN: 12.5000 mg | Freq: Four times a day (QID) | INTRAMUSCULAR | Status: DC | PRN

## 2021-10-19 MED ORDER — ONDANSETRON HCL 4 MG/2ML IJ SOLN: 4.0000 mg | Freq: Four times a day (QID) | INTRAMUSCULAR | Status: DC | PRN

## 2021-10-19 MED ORDER — ONDANSETRON HCL 4 MG/2ML IJ SOLN
INTRAMUSCULAR | Status: DC | PRN
  Administered 2021-10-19: 4 mg via INTRAVENOUS

## 2021-10-19 MED ORDER — CALCIUM POLYCARBOPHIL 625 MG PO TABS
625.0000 mg | ORAL_TABLET | Freq: Two times a day (BID) | ORAL | Status: DC
  Administered 2021-10-19 – 2021-10-22 (×6): 625 mg via ORAL
  Filled 2021-10-19 (×6): qty 1

## 2021-10-19 MED ORDER — ENSURE PRE-SURGERY PO LIQD
296.0000 mL | Freq: Once | ORAL | Status: DC
  Filled 2021-10-19: qty 296

## 2021-10-19 MED ORDER — LACTATED RINGERS IV BOLUS: 1000.0000 mL | Freq: Three times a day (TID) | INTRAVENOUS | Status: AC | PRN

## 2021-10-19 MED ORDER — ENOXAPARIN SODIUM 40 MG/0.4ML IJ SOSY
40.0000 mg | PREFILLED_SYRINGE | Freq: Once | INTRAMUSCULAR | Status: AC
  Administered 2021-10-19: 40 mg via SUBCUTANEOUS
  Filled 2021-10-19: qty 0.4

## 2021-10-19 MED ORDER — PROCHLORPERAZINE MALEATE 10 MG PO TABS
10.0000 mg | ORAL_TABLET | Freq: Four times a day (QID) | ORAL | Status: DC | PRN
  Filled 2021-10-19: qty 1

## 2021-10-19 SURGICAL SUPPLY — 113 items
APPLIER CLIP 5 13 M/L LIGAMAX5 (MISCELLANEOUS)
APPLIER CLIP ROT 10 11.4 M/L (STAPLE)
BAG COUNTER SPONGE SURGICOUNT (BAG) ×3 IMPLANT
BLADE EXTENDED COATED 6.5IN (ELECTRODE) IMPLANT
BLADE SURG SZ11 CARB STEEL (BLADE) ×1 IMPLANT
CANNULA REDUC XI 12-8 STAPL (CANNULA)
CANNULA REDUCER 12-8 DVNC XI (CANNULA) IMPLANT
CELLS DAT CNTRL 66122 CELL SVR (MISCELLANEOUS) IMPLANT
CHLORAPREP W/TINT 26 (MISCELLANEOUS) ×1 IMPLANT
CLIP APPLIE 5 13 M/L LIGAMAX5 (MISCELLANEOUS) IMPLANT
CLIP APPLIE ROT 10 11.4 M/L (STAPLE) IMPLANT
COVER SURGICAL LIGHT HANDLE (MISCELLANEOUS) ×6 IMPLANT
COVER TIP SHEARS 8 DVNC (MISCELLANEOUS) ×2 IMPLANT
COVER TIP SHEARS 8MM DA VINCI (MISCELLANEOUS) ×1
DEVICE TROCAR PUNCTURE CLOSURE (ENDOMECHANICALS) IMPLANT
DRAIN CHANNEL 19F RND (DRAIN) IMPLANT
DRAPE ARM DVNC X/XI (DISPOSABLE) ×8 IMPLANT
DRAPE COLUMN DVNC XI (DISPOSABLE) ×2 IMPLANT
DRAPE DA VINCI XI ARM (DISPOSABLE) ×4
DRAPE DA VINCI XI COLUMN (DISPOSABLE) ×1
DRAPE SURG IRRIG POUCH 19X23 (DRAPES) ×3 IMPLANT
DRSG OPSITE POSTOP 4X10 (GAUZE/BANDAGES/DRESSINGS) IMPLANT
DRSG OPSITE POSTOP 4X6 (GAUZE/BANDAGES/DRESSINGS) IMPLANT
DRSG OPSITE POSTOP 4X8 (GAUZE/BANDAGES/DRESSINGS) ×1 IMPLANT
DRSG TEGADERM 2-3/8X2-3/4 SM (GAUZE/BANDAGES/DRESSINGS) ×11 IMPLANT
DRSG TEGADERM 4X4.75 (GAUZE/BANDAGES/DRESSINGS) IMPLANT
ELECT PENCIL ROCKER SW 15FT (MISCELLANEOUS) ×3 IMPLANT
ELECT REM PT RETURN 15FT ADLT (MISCELLANEOUS) ×3 IMPLANT
ENDOLOOP SUT PDS II  0 18 (SUTURE)
ENDOLOOP SUT PDS II 0 18 (SUTURE) IMPLANT
EVACUATOR SILICONE 100CC (DRAIN) IMPLANT
GAUZE SPONGE 2X2 8PLY STRL LF (GAUZE/BANDAGES/DRESSINGS) ×2 IMPLANT
GLOVE SURG NEOPR MICRO LF SZ8 (GLOVE) ×9 IMPLANT
GLOVE SURG UNDER LTX SZ8 (GLOVE) ×9 IMPLANT
GOWN STRL REUS W/ TWL XL LVL3 (GOWN DISPOSABLE) ×8 IMPLANT
GOWN STRL REUS W/TWL XL LVL3 (GOWN DISPOSABLE) ×4
GRASPER SUT TROCAR 14GX15 (MISCELLANEOUS) IMPLANT
HOLDER FOLEY CATH W/STRAP (MISCELLANEOUS) ×3 IMPLANT
IRRIG SUCT STRYKERFLOW 2 WTIP (MISCELLANEOUS) ×3
IRRIGATION SUCT STRKRFLW 2 WTP (MISCELLANEOUS) ×2 IMPLANT
KIT PROCEDURE DA VINCI SI (MISCELLANEOUS) ×1
KIT PROCEDURE DVNC SI (MISCELLANEOUS) IMPLANT
KIT SIGMOIDOSCOPE (SET/KITS/TRAYS/PACK) IMPLANT
KIT TURNOVER KIT A (KITS) IMPLANT
NDL INSUFFLATION 14GA 120MM (NEEDLE) ×2 IMPLANT
NEEDLE INSUFFLATION 14GA 120MM (NEEDLE) ×3 IMPLANT
PACK CARDIOVASCULAR III (CUSTOM PROCEDURE TRAY) ×3 IMPLANT
PACK COLON (CUSTOM PROCEDURE TRAY) ×3 IMPLANT
PAD POSITIONING PINK XL (MISCELLANEOUS) ×3 IMPLANT
PROTECTOR NERVE ULNAR (MISCELLANEOUS) ×6 IMPLANT
RELOAD STAPLE 45 3.5 BLU DVNC (STAPLE) IMPLANT
RELOAD STAPLE 45 4.3 GRN DVNC (STAPLE) IMPLANT
RELOAD STAPLE 60 3.5 BLU DVNC (STAPLE) IMPLANT
RELOAD STAPLE 60 4.3 GRN DVNC (STAPLE) IMPLANT
RELOAD STAPLER 3.5X45 BLU DVNC (STAPLE) IMPLANT
RELOAD STAPLER 3.5X60 BLU DVNC (STAPLE) IMPLANT
RELOAD STAPLER 4.3X45 GRN DVNC (STAPLE) IMPLANT
RELOAD STAPLER 4.3X60 GRN DVNC (STAPLE) ×2 IMPLANT
RETRACTOR WND ALEXIS 18 MED (MISCELLANEOUS) IMPLANT
RTRCTR WOUND ALEXIS 18CM MED (MISCELLANEOUS)
SCISSORS LAP 5X35 DISP (ENDOMECHANICALS) ×3 IMPLANT
SEAL CANN UNIV 5-8 DVNC XI (MISCELLANEOUS) ×6 IMPLANT
SEAL XI 5MM-8MM UNIVERSAL (MISCELLANEOUS) ×3
SEALER VESSEL DA VINCI XI (MISCELLANEOUS) ×1
SEALER VESSEL EXT DVNC XI (MISCELLANEOUS) ×2 IMPLANT
SOLUTION ELECTROLUBE (MISCELLANEOUS) ×3 IMPLANT
SPIKE FLUID TRANSFER (MISCELLANEOUS) ×3 IMPLANT
SPONGE GAUZE 2X2 STER 10/PKG (GAUZE/BANDAGES/DRESSINGS) ×1
STAPLER 45 DA VINCI SURE FORM (STAPLE)
STAPLER 45 SUREFORM DVNC (STAPLE) IMPLANT
STAPLER 60 DA VINCI SURE FORM (STAPLE) ×1
STAPLER 60 SUREFORM DVNC (STAPLE) IMPLANT
STAPLER CANNULA SEAL DVNC XI (STAPLE) ×2 IMPLANT
STAPLER CANNULA SEAL XI (STAPLE) ×1
STAPLER ECHELON POWER CIR 29 (STAPLE) IMPLANT
STAPLER ECHELON POWER CIR 31 (STAPLE) ×1 IMPLANT
STAPLER RELOAD 3.5X45 BLU DVNC (STAPLE)
STAPLER RELOAD 3.5X45 BLUE (STAPLE)
STAPLER RELOAD 3.5X60 BLU DVNC (STAPLE)
STAPLER RELOAD 3.5X60 BLUE (STAPLE)
STAPLER RELOAD 4.3X45 GREEN (STAPLE)
STAPLER RELOAD 4.3X45 GRN DVNC (STAPLE)
STAPLER RELOAD 4.3X60 GREEN (STAPLE) ×1
STAPLER RELOAD 4.3X60 GRN DVNC (STAPLE) ×2
STOPCOCK 4 WAY LG BORE MALE ST (IV SETS) ×6 IMPLANT
SURGILUBE 2OZ TUBE FLIPTOP (MISCELLANEOUS) IMPLANT
SUT MNCRL AB 4-0 PS2 18 (SUTURE) ×3 IMPLANT
SUT PDS AB 1 CT1 27 (SUTURE) ×7 IMPLANT
SUT PROLENE 0 CT 2 (SUTURE) IMPLANT
SUT PROLENE 2 0 KS (SUTURE) IMPLANT
SUT PROLENE 2 0 SH DA (SUTURE) ×2 IMPLANT
SUT SILK 2 0 (SUTURE)
SUT SILK 2 0 SH CR/8 (SUTURE) IMPLANT
SUT SILK 2-0 18XBRD TIE 12 (SUTURE) IMPLANT
SUT SILK 3 0 (SUTURE)
SUT SILK 3 0 SH CR/8 (SUTURE) ×3 IMPLANT
SUT SILK 3-0 18XBRD TIE 12 (SUTURE) IMPLANT
SUT V-LOC BARB 180 2/0GR6 GS22 (SUTURE)
SUT VIC AB 3-0 SH 18 (SUTURE) IMPLANT
SUT VIC AB 3-0 SH 27 (SUTURE)
SUT VIC AB 3-0 SH 27XBRD (SUTURE) IMPLANT
SUT VICRYL 0 UR6 27IN ABS (SUTURE) ×3 IMPLANT
SUTURE V-LC BRB 180 2/0GR6GS22 (SUTURE) IMPLANT
SYR 10ML ECCENTRIC (SYRINGE) ×3 IMPLANT
SYS LAPSCP GELPORT 120MM (MISCELLANEOUS)
SYS WOUND ALEXIS 18CM MED (MISCELLANEOUS) ×3
SYSTEM LAPSCP GELPORT 120MM (MISCELLANEOUS) IMPLANT
SYSTEM WOUND ALEXIS 18CM MED (MISCELLANEOUS) ×2 IMPLANT
TOWEL OR NON WOVEN STRL DISP B (DISPOSABLE) ×3 IMPLANT
TRAY FOLEY MTR SLVR 16FR STAT (SET/KITS/TRAYS/PACK) ×3 IMPLANT
TROCAR ADV FIXATION 5X100MM (TROCAR) ×3 IMPLANT
TUBING CONNECTING 10 (TUBING) ×6 IMPLANT
TUBING INSUFFLATION 10FT LAP (TUBING) ×3 IMPLANT

## 2021-10-19 NOTE — Anesthesia Preprocedure Evaluation (Signed)
Anesthesia Evaluation  ?Patient identified by MRN, date of birth, ID band ?Patient awake ? ? ? ?Reviewed: ?Allergy & Precautions, NPO status , Patient's Chart, lab work & pertinent test results ? ?Airway ?Mallampati: I ? ? ? ? ? ? Dental ?no notable dental hx. ? ?  ?Pulmonary ?sleep apnea ,  ?  ?Pulmonary exam normal ? ? ? ? ? ? ? Cardiovascular ?negative cardio ROS ?Normal cardiovascular exam ? ? ?  ?Neuro/Psych ?Anxiety negative neurological ROS ?   ? GI/Hepatic ?Neg liver ROS,   ?Endo/Other  ?negative endocrine ROS ? Renal/GU ?negative Renal ROS  ?negative genitourinary ?  ?Musculoskeletal ?negative musculoskeletal ROS ?(+)  ? Abdominal ?Normal abdominal exam  (+)   ?Peds ? Hematology ?negative hematology ROS ?(+)   ?Anesthesia Other Findings ? ? Reproductive/Obstetrics ?negative OB ROS ? ?  ? ? ? ? ? ? ? ? ? ? ? ? ? ?  ?  ? ? ? ? ? ? ? ? ?Anesthesia Physical ?Anesthesia Plan ? ?ASA: 2 ? ?Anesthesia Plan: General  ? ?Post-op Pain Management: Dilaudid IV  ? ?Induction: Intravenous ? ?PONV Risk Score and Plan: 4 or greater and Ondansetron, Dexamethasone and Midazolam ? ?Airway Management Planned: Oral ETT ? ?Additional Equipment: None ? ?Intra-op Plan:  ? ?Post-operative Plan: Extubation in OR ? ?Informed Consent: I have reviewed the patients History and Physical, chart, labs and discussed the procedure including the risks, benefits and alternatives for the proposed anesthesia with the patient or authorized representative who has indicated his/her understanding and acceptance.  ? ? ? ?Dental advisory given ? ?Plan Discussed with: CRNA ? ?Anesthesia Plan Comments:   ? ? ? ? ? ? ?Anesthesia Quick Evaluation ? ?

## 2021-10-19 NOTE — H&P (Signed)
?10/19/2021 ? ? ? ? ?REFERRING PHYSICIAN: Lear Ng, MD ? ?Patient Care Team: ?Arminda Resides., DO as PCP - General (Family Medicine) ? ?PROVIDER: Hollace Kinnier, MD ? ?[facility] MRN: [id number] ?DOB: March 17, 1971 ?DATE OF ENCOUNTER: 09/12/2021 ? ?SUBJECTIVE  ? ?Chief Complaint: colon mass ? ? ?History of Present Illness: ?Jimmy [name] is a 51 y.o. male who is seen today  ?as an office consultation at the request of Dr. Michail Sermon  ?for evaluation of colon mass ?.  ? ?Pleasant active gentleman. 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 Mostly light duty now. Underwent screening colonoscopy by Dr. Michail Sermon with [facility]. Found to have bulky mass in proximal sigmoid colon. Biopsied and tattooed. Pathology consistent with moderately differentiated invasive adenocarcinoma. Surgical consultation requested. Patient here today with his wife. ? ?Patient denies any history of any colorectal cancer or other issues in his family. His father did have bladder cancer that was aggressive and spread. He is does not have any urinary issues. He does not smoke. No diabetes. He does have sleep apnea but uses CPAP and is quite functional. Followed by neurology through the [facility] in [place]. 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 No exertional chest pain or shortness of breath. Distant history of some chest pain and seen by Dr. Basilio Cairo. Patient recalls work-up rather underwhelming. ? ?No personal nor family history of GI/colon cancer, inflammatory bowel disease, irritable bowel syndrome, allergy such as Celiac Sprue, dietary/dairy problems, colitis, ulcers nor gastritis. No recent sick contacts/gastroenteritis. No travel outside the country. No changes in diet. No dysphagia to solids or liquids. No significant heartburn or reflux. No hematochezia, hematemesis, coffee ground emesis. No evidence of prior gastric/peptic ulceration. ? ?Medical History: ? ?Past Medical History:   ?Diagnosis Date  ? Anxiety  ? Sleep apnea  ? ?There is no problem list on file for this patient. ? ?History reviewed. No pertinent surgical history.  ? ?No Known Allergies ? ?Current Outpatient Medications on File Prior to Visit  ?Medication Sig Dispense Refill  ? amitriptyline (ELAVIL) 50 MG tablet TAKE 1 TABLET BY MOUTH EVERY DAY AT BEDTIME FOR 90 DAYS  ? sertraline (ZOLOFT) 100 MG tablet Take by mouth  ? testosterone cypionate (DEPO-TESTOSTERONE) 200 mg/mL injection INJECT 1 CC INTRAMUSCULARLY EVERY 2 WEEKS 28 DAYS  ? ?No current facility-administered medications on file prior to visit.  ? ?No family history on file.  ? ?Social History  ? ?Tobacco Use  ?Smoking Status Never  ?Smokeless Tobacco Never  ? ? ?Social History  ? ?Socioeconomic History  ? Marital status: Married  ?Tobacco Use  ? Smoking status: Never  ? Smokeless tobacco: Never  ?Substance and Sexual Activity  ? Alcohol use: Yes  ? Drug use: Never  ? ?############################################################ ? ?Review of Systems: ?A complete review of systems (ROS) was obtained from the patient. I have reviewed this information and discussed as appropriate with the patient. See HPI as well for other pertinent ROS. ? ?Constitutional: No fevers, chills, sweats. Weight stable ?Eyes: No vision changes, No discharge ?HENT: No sore throats, nasal drainage ?Lymph: No neck swelling, No bruising easily ?Pulmonary: No cough, productive sputum ?CV: No orthopnea, PND Patient walks 60 minutes for about 3 miles without difficulty. No exertional chest/neck/shoulder/arm pain. ? ?GI: No personal nor family history of GI/colon cancer, inflammatory bowel disease, irritable bowel syndrome, allergy such as Celiac Sprue, dietary/dairy problems, colitis, ulcers nor gastritis. No recent sick contacts/gastroenteritis. No travel outside the  country. No changes in diet. ? ?Renal: No UTIs, No hematuria ?Genital: No drainage, bleeding, masses ?Musculoskeletal: No severe joint  pain. Good ROM major joints ?Skin: No sores or lesions ?Heme/Lymph: No easy bleeding. No swollen lymph nodes ? ?OBJECTIVE  ? ?Vitals:  ?09/12/21 1147  ?BP: 128/84  ?Pulse: 76  ?Temp: 36.8 ?C (98.3 ?F)  ?SpO2: 97%  ?Weight: (!) 107.2 kg (236 lb 6.4 oz)  ?Height: 186.7 cm (6' 1.5")  ? ? ?Body mass index is 30.77 kg/m?. ? ?PHYSICAL EXAM: ? ?Constitutional: Not cachectic. Hygeine adequate. Vitals signs as above.  ?Eyes: Pupils reactive, normal extraocular movements. Sclera nonicteric ?Neuro: CN II-XII intact. No major focal sensory defects. No major motor deficits. ?Lymph: No head/neck/groin lymphadenopathy ?Psych: No severe agitation. No severe anxiety. Judgment & insight Adequate, Oriented x4, ?HENT: Normocephalic, Mucus membranes moist. No thrush. Hearing: adequate ?Neck: Supple, No tracheal deviation. No obvious thyromegaly ?Chest: No pain to chest wall compression. Good respiratory excursion. No audible wheezing ?CV: Pulses intact. Regular rhythm. No major extremity edema ?Ext: No obvious deformity or contracture. Edema: Not present. No cyanosis ?Skin: No major subcutaneous nodules. Warm and dry ?Musculoskeletal: Severe joint rigidity not present. No obvious clubbing. No digital petechiae. Mobility: no assist device moving easily without restrictions ? ?Abdomen:  ?Obese. Soft. Nondistended. Nontender. Hernia: Present at: umbilicus, size [id number]. Diastasis recti: Mild supraumbilical midline. No hepatomegaly. No splenomegaly. ? ?Genital/Pelvic: Inguinal hernia: Not present. Inguinal lymph nodes: without lymphadenopathy nor hidradenitis.  ? ?Rectal: (Deferred) ? ? ? ?################################################################### ? ?Labs, Imaging and Diagnostic Testing: ? ?Located in [place]' section of Epic EMR chart ? ?PRIOR [facility] NOTES: ? ?Not applicable ? ?SURGERY NOTES: ? ?Not applicable ? ?PATHOLOGY: ? ?Located in [place]' section of Epic EMR chart ? ?Assessment and Plan:   ?DIAGNOSES: ? ?Diagnoses and all orders for this visit: ? ?Cancer of sigmoid colon (CMS-HCC) ? ?Other orders ?- polyethylene glycol (MIRALAX) powder; Take 233.75 g by mouth once for 1 dose Take according to your procedure prep instructions. ?- bisacodyL (DULCOLAX) 5 mg EC tablet; Take 4 tablets (20 mg total) by mouth once daily as needed for Constipation for up to 1 dose ?- metroNIDAZOLE (FLAGYL) 500 MG tablet; Take 2 tablets (1,000 mg total) by mouth 3 (three) times daily for 3 doses SEE BOWEL PREP INSTRUCTIONS: Take 2 tablets at 2pm, 3pm, and 10pm the day prior to your colon operation. ?- neomycin 500 mg tablet; Take 2 tablets (1,000 mg total) by mouth 3 (three) times daily for 3 doses SEE BOWEL PREP INSTRUCTIONS: Take 2 tablets at 2pm, 3pm, and 10pm the day prior to your colon operation. ? ? ? ?ASSESSMENT/PLAN ? ?Colon cancer presumed to be in the sigmoid colon. ? ?I recommended robotic segmental colonic resection.  ? ?The anatomy & physiology of the digestive tract was discussed. The pathophysiology of the colon was discussed. Natural history risks without surgery was discussed. I feel the risks of no intervention will lead to serious problems that outweigh the operative risks; therefore, I recommended a partial colectomy to remove the pathology. Minimally invasive (Robotic/Laparoscopic) & open techniques were discussed.  ? ?Risks such as bleeding, infection, abscess, leak, reoperation, injury to other organs, need for repair of tissues / organs, possible ostomy, hernia, heart attack, stroke, death, and other risks were discussed. I noted a good likelihood this will help address the problem. Goals of post-operative recovery were discussed as well. Need for adequate nutrition, daily bowel regimen and healthy physical activity, to optimize recovery was  noted as well. We will work to minimize complications. Educational materials were available as well. Questions were answered. The patient expresses understanding &  wishes to proceed with surgery. ? ?Small umbilical hernia about 2 cm. Most likely can reduce and primarily repair at the time of surgery. ? ?The anatomy & physiology of the abdominal wall was discussed. The pathophysiology o

## 2021-10-19 NOTE — Op Note (Signed)
10/19/2021 ? ?2:30 PM ? ?PATIENT:  Jimmy [name]  51 y.o. male ? ?Patient Care Team: ?Enid Skeens., MD as PCP - General (Family Medicine) ?Derris Boston, MD as Consulting Physician (General Surgery) ?Wilford Corner, MD as Consulting Physician (Gastroenterology) ?Revankar, Reita Cliche, MD as Consulting Physician (Cardiology) ? ?PRE-OPERATIVE DIAGNOSIS:  SIGMOID CANCER ? ?POST-OPERATIVE DIAGNOSIS:   ?PROXIMAL SIGMOID CANCER  ?INCARCERATED UMBILICAL HERNIA ? ?PROCEDURE:   ?ROBOTIC LEFT HEMI COLECTOMY ?MOBILIZATION SPLENIC FLEXURE OF COLON ?PRIMARY UMBILICAL HERNIA REPAIR,   ?INTRAOPERATIVE ASSESSMENT OF TISSUE VASCULAR PERFUSION USING ICG (indocyanine green) IMMUNOFLUORESCENCE ?TRANSVERSUS ABDOMINIS PLANE (TAP) BLOCK - BILATERAL ?RIGID PROCTOSCOPY ? ?SURGEON:  Adin Hector, MD ? ?ASSISTANT: Leighton Ruff, MD, FACS, FASCRS  ?An experienced assistant was required given the standard of surgical care given the complexity of the case.  This assistant was needed for exposure, dissection, suction, tissue approximation, retraction, perception, etc. ? ?ANESTHESIA:    ? ?General ? ?Regional TRANSVERSUS ABDOMINIS PLANE (TAP) nerve block for perioperative & postoperative pain control provided with liposomal bupivacaine (Experel) mixed with 0.25% bupivacaine as a Bilateral TAP block x 74m each side at the level of the transverse abdominis & preperitoneal spaces along the flank at the anterior axillary line, from subcostal ridge to iliac crest under laparoscopic guidance  ? ?Local field block at port sites & extraction wound ? ?EBL:  Total I/O ?In: 1500 [I.V.:1500] ?Out: -  ? ?Delay start of Pharmacological VTE agent (>24hrs) due to surgical blood loss or risk of bleeding:  no ? ?DRAINS: No ? ?SPECIMEN:   ?DESCENDING & SIGMOID COLON (open end proximal) ?DISTAL ANASTOMOTIC RING (final distal margin) ? ?DISPOSITION OF SPECIMEN:  PATHOLOGY ? ?COUNTS:  YES ? ?PLAN OF CARE: Admit to inpatient  ? ?PATIENT DISPOSITION:  PACU -  hemodynamically stable. ? ?INDICATION:   ? ?Pleasant patient found to have proximal sigmoid tumor with biopsy showing adenocarcinoma.  No strong evidence of any major metastatic disease.  I recommended segmental resection: ? ?The anatomy & physiology of the digestive tract was discussed.  The pathophysiology was discussed.  Natural history risks without surgery was discussed.   I worked to give an overview of the disease and the frequent need to have multispecialty involvement.  I feel the risks of no intervention will lead to serious problems that outweigh the operative risks; therefore, I recommended a partial colectomy to remove the pathology.  Laparoscopic & open techniques were discussed.   ?Risks such as bleeding, infection, abscess, leak, reoperation, possible ostomy, hernia, heart attack, death, and other risks were discussed.  I noted a good likelihood this will help address the problem.   Goals of post-operative recovery were discussed as well.  We will work to minimize complications.  Educational materials on the pathology had been given in the office.  Questions were answered.   ? ?The patient expressed understanding & wished to proceed with surgery. ? ?OR FINDINGS:  ? ?Patient had bulky mass in the proximal sigmoid colon just proximal to tattooing. ?No obvious metastatic disease on visceral parietal peritoneum or liver. ?It is a 31 end splenic flexure to end rectosigmoid EEA anastomosis that rests 16-17 cm from the anal verge by rigid proctoscopy. ? ?Patient had a 2 x 1.5 cm umbilical hernia incarcerated with some omentum.  Reduced and primarily repaired transversely with #1 PDS ? ?CASE DATA: ? ?Type of patient?: Elective [facility] Private Case ? ?Status of Case? Elective Scheduled ? ?Infection Present At Time Of Surgery (PATOS)?  NO ? ?DESCRIPTION:  ? ?  Informed consent was confirmed.  The patient underwent general anaesthesia without difficulty.  The patient was positioned appropriately.  VTE prevention in  place.  The patient was clipped, prepped, & draped in a sterile fashion.  Surgical timeout confirmed our plan. ? ?The patient was positioned in reverse Trendelenburg.  Abdominal entry was gained using Varess technique at the left subcostal ridge on the anterior abdominal wall.  No elevated EtCO2 noted.  Port placed.  Camera inspection revealed no injury.  Extra ports were carefully placed under direct laparoscopic visualization.  Moderate tattooing noted on the visceral peritoneum with a focus in the proximal sigmoid colon with thickening.  No obvious evidence of any visceral parietal metastatic disease or carcinomatosis.   I reflected the greater omentum and the upper abdomen the small bowel in the upper abdomen.  Patient had omentum going into an umbilical hernia somewhat incarcerated.  He was able to reduce that down.  The patient was carefully positioned.  The [employer] daVinci robot was docked with camera & instruments carefully placed. ? ?I mobilized the rectosigmoid colon & elevated it to put the main pedicle on tension.  I scored the base of peritoneum of the medial side of the mesentery of the elevated left colon from the ligament of Treitz to the mid rectum.   I elevated the sigmoid mesentery and entered into the retro-mesenteric plane. We were able to identify the left ureter and gonadal vessels. We kept those posterior within the retroperitoneum and elevated the left colon mesentery off that. I did isolate the inferior mesenteric artery (IMA) pedicle but did not ligate it yet.  I continued distally and got into the avascular plane posterior to the mesorectum, sparing the nervi ergentes.. This allowed me to help mobilize the rectum as well by freeing the mesorectum off the sacrum.  I stayed away from the right and left ureters.  I kept the lateral vascular pedicles to the rectum intact. ? ?I skeletonized the lymph nodes off the inferior mesenteric artery pedicle.  I went down to its takeoff from the  aorta.   I isolated the inferior mesenteric vein off of the ligament of Treitz just cephalad to that as well.  After confirming the left ureter was out of the way, I went ahead and ligated the inferior mesenteric artery pedicle just near its takeoff from the aorta.  Encountered brisk bleeding from the stump.  Initially tried to cold with pressure and cautery but did not satisfactory.  Therefore used 2-0 Prolene figure-of-eight sutures x2 to provide better hemostasis.  We then mobilized the left colon mesentery cephalad to do more aggressive dissection since this tumor was more proximal than it initially suspected.  I did ligate the inferior mesenteric vein in a similar fashion.  We ensured hemostasis.  I continued medial to lateral dissection to free the left colon mesentery off the retroperitoneum going up towards the splenic flexure to allow good mobility and protect the colon mesentery.  I mobilized the left colon in a lateral to medial fashion off the retroperitoneum and sidewall attachments along the line of Toldt up towards the splenic flexure to ensure good mobilization of the remaining left colon to reach into the pelvis.  ? ?We then focused on mesorectal dissection.  Freed the mesorectum off the presacral plane until I was distal to the concerning region.  Freed off peritoneum on the lateral sidewalls as well and transected the mesentery of the lateral pedicles to get distal to the area of concern.  Came  around anteriorly such that I had good circumferential mesorectal excision and a good margin distal to the area of concern.  I chose a region at the mid-descending colon that was soft and easily reached down to the rectal stump.  I transected the mesentery of the colon radially to preserve remaining colon blood supply.  I skeletonized the mesorectum  ? ?To access vascular perfusion of tissues, we asked anesthesia use intravenous  indocyanine green (ICG) with IV flush.  I switched to the NIR fluorescence  (Firefly mode) imaging window on the daVinci robot platform.  We were able to see good light green visualization of blood vessels with good vascular perfusion of tissues.  Poor feeling of the descending colon with

## 2021-10-19 NOTE — Anesthesia Procedure Notes (Signed)
Procedure Name: Intubation ?Date/Time: 10/19/2021 11:12 AM ?Performed by: Lind Covert, CRNA ?Pre-anesthesia Checklist: Patient identified, Emergency Drugs available, Suction available and Patient being monitored ?Patient Re-evaluated:Patient Re-evaluated prior to induction ?Oxygen Delivery Method: Circle system utilized ?Preoxygenation: Pre-oxygenation with 100% oxygen ?Induction Type: IV induction ?Ventilation: Mask ventilation without difficulty ?Laryngoscope Size: Mac and 4 ?Grade View: Grade I ?Tube type: Oral ?Tube size: 7.5 mm ?Number of attempts: 1 ?Airway Equipment and Method: Stylet ?Placement Confirmation: ETT inserted through vocal cords under direct vision, positive ETCO2 and breath sounds checked- equal and bilateral ?Secured at: 23 cm ?Tube secured with: Tape ?Dental Injury: Teeth and Oropharynx as per pre-operative assessment  ? ? ? ? ?

## 2021-10-19 NOTE — Anesthesia Postprocedure Evaluation (Signed)
Anesthesia Post Note ? ?Patient: Jimmy [name] ? ?Procedure(s) Performed: ROBOTIC LEFT HEMI COLECTOMY, MOBILIZATION SPLENIC FLEXURE OF COLON, PRIMARY UMBILICAL HERNIA REPAIR,  ASSESSMENT OF PERFUSION USING FIREFLY, AND TAP BLOCK (Left) ?RIGIDI PROCTOSCOPY ? ?  ? ?Patient location during evaluation: PACU ?Anesthesia Type: General ?Level of consciousness: awake and sedated ?Pain management: pain level controlled ?Vital Signs Assessment: post-procedure vital signs reviewed and stable ?Respiratory status: spontaneous breathing ?Cardiovascular status: stable ?Postop Assessment: no apparent nausea or vomiting ?Anesthetic complications: no ? ? ?No notable events documented. ? ?Last Vitals:  ?Vitals:  ? 10/19/21 1445 10/19/21 1500  ?BP: 123/78 (!) 124/95  ?Pulse: 86 85  ?Resp: 16 17  ?Temp: 36.6 ?C   ?SpO2: 100% 100%  ?  ?Last Pain:  ?Vitals:  ? 10/19/21 1500  ?TempSrc:   ?PainSc: Asleep  ? ? ?  ?  ?  ?  ?  ?  ? ?Huston Foley ? ? ? ? ?

## 2021-10-19 NOTE — Transfer of Care (Signed)
Immediate Anesthesia Transfer of Care Note ? ?Patient: Jimmy [name] ? ?Procedure(s) Performed: ROBOTIC LEFT HEMI COLECTOMY, MOBILIZATION SPLENIC FLEXURE OF COLON, PRIMARY UMBILICAL HERNIA REPAIR,  ASSESSMENT OF PERFUSION USING FIREFLY, AND TAP BLOCK (Left) ?RIGIDI PROCTOSCOPY ? ?Patient Location: PACU ? ?Anesthesia Type:General ? ?Level of Consciousness: sedated ? ?Airway & Oxygen Therapy: Patient Spontanous Breathing and Patient connected to face mask oxygen ? ?Post-op Assessment: Report given to RN and Post -op Vital signs reviewed and stable ? ?Post vital signs: Reviewed and stable ? ?Last Vitals:  ?Vitals Value Taken Time  ?BP 123/75 10/19/21 1427  ?Temp    ?Pulse 84 10/19/21 1428  ?Resp 13 10/19/21 1428  ?SpO2 98 % 10/19/21 1428  ?Vitals shown include unvalidated device data. ? ?Last Pain:  ?Vitals:  ? 10/19/21 0939  ?TempSrc:   ?PainSc: 5   ?   ? ?  ? ?Complications: No notable events documented. ?

## 2021-10-19 NOTE — Discharge Instructions (Signed)
SURGERY: POST OP INSTRUCTIONS ?(Surgery for small bowel obstruction, colon resection, etc) ? ? ?###################################################################### ? ?EAT ?Gradually transition to a high fiber diet with a fiber supplement over the next few days after discharge ? ?WALK ?Walk an hour a day.  Control your pain to do that.   ? ?CONTROL PAIN ?Control pain so that you can walk, sleep, tolerate sneezing/coughing, go up/down stairs. ? ?HAVE A BOWEL MOVEMENT DAILY ?Keep your bowels regular to avoid problems.  OK to try a laxative to override constipation.  OK to use an antidairrheal to slow down diarrhea.  Call if not better after 2 tries ? ?CALL IF YOU HAVE PROBLEMS/CONCERNS ?Call if you are still struggling despite following these instructions. ?Call if you have concerns not answered by these instructions ? ?###################################################################### ? ? ?DIET ?Follow a light diet the first few days at home.  Start with a bland diet such as soups, liquids, starchy foods, low fat foods, etc.  If you feel full, bloated, or constipated, stay on a ful liquid or pureed/blenderized diet for a few days until you feel better and no longer constipated. ?Be sure to drink plenty of fluids every day to avoid getting dehydrated (feeling dizzy, not urinating, etc.). ?Gradually add a fiber supplement to your diet over the next week.  Gradually get back to a regular solid diet.  Avoid fast food or heavy meals the first week as you are more likely to get nauseated. ?It is expected for your digestive tract to need a few months to get back to normal.  It is common for your bowel movements and stools to be irregular.  You will have occasional bloating and cramping that should eventually fade away.  Until you are eating solid food normally, off all pain medications, and back to regular activities; your bowels will not be normal. ?Focus on eating a low-fat, high fiber diet the rest of your life  (See Getting to [place], below). ? ?CARE of your INCISION or WOUND ?It is good for closed incision and even open wounds to be washed every day.  Shower every day.  Short baths are fine.  Wash the incisions and wounds clean with soap & water.    ? ?If you have a closed incision(s), wash the incision with soap & water every day.  You may leave closed incisions open to air if it is dry.   You may cover the incision with clean gauze & replace it after your daily shower for comfort. ? ?It is good for closed incisions and even open wounds to be washed every day.  Shower every day.  Short baths are fine.  Wash the incisions and wounds clean with soap & water.    ?You may leave closed incisions open to air if it is dry.   You may cover the incision with clean gauze & replace it after your daily shower for comfort. ? ?TEGADERM:  You have clear gauze band-aid dressings over your closed incision(s).  Remove the dressings 3 days after surgery.= 3/19 ? ? ?If you have an open wound with a wound vac, see wound vac care instructions. ? ? ? ? ?ACTIVITIES as tolerated ?Start light daily activities --- self-care, walking, climbing stairs-- beginning the day after surgery.  Gradually increase activities as tolerated.  Control your pain to be active.  Stop when you are tired.  Ideally, walk several times a day, eventually an hour a day.   ?Most people are back to most day-to-day activities  in a few weeks.  It takes 4-8 weeks to get back to unrestricted, intense activity. ?If you can walk 30 minutes without difficulty, it is safe to try more intense activity such as jogging, treadmill, bicycling, low-impact aerobics, swimming, etc. ?Save the most intensive and strenuous activity for last (Usually 4-8 weeks after surgery) such as sit-ups, heavy lifting, contact sports, etc.  Refrain from any intense heavy lifting or straining until you are off narcotics for pain control.  You will have off days, but things should improve  week-by-week. ?DO NOT PUSH THROUGH PAIN.  Let pain be your guide: If it hurts to do something, don't do it.  Pain is your body warning you to avoid that activity for another week until the pain goes down. ?You may drive when you are no longer taking narcotic prescription pain medication, you can comfortably wear a seatbelt, and you can safely make sudden turns/stops to protect yourself without hesitating due to pain. ?You may have sexual intercourse when it is comfortable. If it hurts to do something, stop. ? ?MEDICATIONS ?Take your usually prescribed home medications unless otherwise directed.   ?Blood thinners:  ?Usually you can restart any strong blood thinners after the second postoperative day.  It is OK to take aspirin right away.    ? If you are on strong blood thinners (warfarin/Coumadin, Plavix, Xerelto, Eliquis, Pradaxa, etc), discuss with your surgeon, medicine PCP, and/or cardiologist for instructions on when to restart the blood thinner & if blood monitoring is needed (PT/INR blood check, etc).   ? ? ?PAIN CONTROL ?Pain after surgery or related to activity is often due to strain/injury to muscle, tendon, nerves and/or incisions.  This pain is usually short-term and will improve in a few months.  ?To help speed the process of healing and to get back to regular activity more quickly, DO THE FOLLOWING THINGS TOGETHER: ?Increase activity gradually.  DO NOT PUSH THROUGH PAIN ?Use Ice and/or Heat ?Try Gentle Massage and/or Stretching ?Take over the counter pain medication ?Take Narcotic prescription pain medication for more severe pain ? ?Good pain control = faster recovery.  It is better to take more medicine to be more active than to stay in bed all day to avoid medications. ? Increase activity gradually ?Avoid heavy lifting at first, then increase to lifting as tolerated over the next 6 weeks. ?Do not ?push through? the pain.  Listen to your body and avoid positions and maneuvers than reproduce the pain.   Wait a few days before trying something more intense ?Walking an hour a day is encouraged to help your body recover faster and more safely.  Start slowly and stop when getting sore.  If you can walk 30 minutes without stopping or pain, you can try more intense activity (running, jogging, aerobics, cycling, swimming, treadmill, sex, sports, weightlifting, etc.) ?Remember: If it hurts to do it, then don?t do it! ?Use Ice and/or Heat ?You will have swelling and bruising around the incisions.  This will take several weeks to resolve. ?Ice packs or heating pads (6-8 times a day, 30-60 minutes at a time) will help sooth soreness & bruising. ?Some people prefer to use ice alone, heat alone, or alternate between ice & heat.  Experiment and see what works best for you.  Consider trying ice for the first few days to help decrease swelling and bruising; then, switch to heat to help relax sore spots and speed recovery. ?Shower every day.  Short baths are fine.  It feels  good!  Keep the incisions and wounds clean with soap & water.   ?Try Gentle Massage and/or Stretching ?Massage at the area of pain many times a day ?Stop if you feel pain - do not overdo it ?Take over the counter pain medication ?This helps the muscle and nerve tissues become less irritable and calm down faster ?Choose ONE of the following over-the-counter anti-inflammatory medications: ?Acetaminophen '500mg'$  tabs (Tylenol) 1-2 pills with every meal and just before bedtime (avoid if you have liver problems or if you have acetaminophen in you narcotic prescription) ?Naproxen '220mg'$  tabs (ex. Aleve, Naprosyn) 1-2 pills twice a day (avoid if you have kidney, stomach, IBD, or bleeding problems) ?Ibuprofen '200mg'$  tabs (ex. Advil, Motrin) 3-4 pills with every meal and just before bedtime (avoid if you have kidney, stomach, IBD, or bleeding problems) ?Take with food/snack several times a day as directed for at least 2 weeks to help keep pain / soreness down & more  manageable. ?Take Narcotic prescription pain medication for more severe pain ?A prescription for strong pain control is often given to you upon discharge (for example: oxycodone/Percocet, hydrocodone/Norco/Vicodin, or

## 2021-10-19 NOTE — Plan of Care (Signed)
Pt arrived to floor complains of being cold temperature 96.4. Pt was wrapped with warm blankets and thermostat was adjusted to heat. Pt states he is not in any pain at this time. Will continue to monitor. ?

## 2021-10-20 ENCOUNTER — Encounter (HOSPITAL_COMMUNITY): Payer: Self-pay | Admitting: Surgery

## 2021-10-20 LAB — BASIC METABOLIC PANEL
Anion gap: 11 (ref 5–15)
BUN: 14 mg/dL (ref 6–20)
CO2: 24 mmol/L (ref 22–32)
Calcium: 8.3 mg/dL — ABNORMAL LOW (ref 8.9–10.3)
Chloride: 100 mmol/L (ref 98–111)
Creatinine, Ser: 1.01 mg/dL (ref 0.61–1.24)
GFR, Estimated: >60 mL/min (ref >60)
Glucose, Bld: 98 mg/dL (ref 70–99)
Potassium: 3.6 mmol/L (ref 3.5–5.1)
Sodium: 135 mmol/L (ref 135–145)

## 2021-10-20 LAB — CBC
HCT: 40.1 % (ref 39.0–52.0)
Hemoglobin: 12.7 g/dL — ABNORMAL LOW (ref 13.0–17.0)
MCH: 30 pg (ref 26.0–34.0)
MCHC: 31.7 g/dL (ref 30.0–36.0)
MCV: 94.8 fL (ref 80.0–100.0)
Platelets: 266 10*3/uL (ref 150–400)
RBC: 4.23 MIL/uL (ref 4.22–5.81)
RDW: 12.7 % (ref 11.5–15.5)
WBC: 10.8 10*3/uL — ABNORMAL HIGH (ref 4.0–10.5)
nRBC: 0 % (ref 0.0–0.2)

## 2021-10-20 LAB — MAGNESIUM: Magnesium: 1.8 mg/dL (ref 1.7–2.4)

## 2021-10-20 MED ORDER — SODIUM CHLORIDE 0.9% FLUSH: 3.0000 mL | INTRAVENOUS | Status: DC | PRN

## 2021-10-20 MED ORDER — SODIUM CHLORIDE 0.9% FLUSH
3.0000 mL | Freq: Two times a day (BID) | INTRAVENOUS | Status: DC
  Administered 2021-10-20 – 2021-10-21 (×4): 3 mL via INTRAVENOUS

## 2021-10-20 MED ORDER — SODIUM CHLORIDE 0.9 % IV SOLN: 250.0000 mL | INTRAVENOUS | Status: DC | PRN

## 2021-10-20 MED ORDER — POTASSIUM CHLORIDE CRYS ER 20 MEQ PO TBCR
40.0000 meq | EXTENDED_RELEASE_TABLET | Freq: Every day | ORAL | Status: AC
  Administered 2021-10-20 – 2021-10-22 (×3): 40 meq via ORAL
  Filled 2021-10-20 (×3): qty 2

## 2021-10-20 NOTE — Progress Notes (Signed)
? ?EAVEN [name] ?[id number] ?17-Jan-1971 ? ?CARE TEAM: ? ?PCP: Enid Skeens., MD ? ?Outpatient Care Team: Patient Care Team: ?Enid Skeens., MD as PCP - General (Family Medicine) ?Ehren Boston, MD as Consulting Physician (General Surgery) ?Wilford Corner, MD as Consulting Physician (Gastroenterology) ?Revankar, Reita Cliche, MD as Consulting Physician (Cardiology) ? ?Inpatient Treatment Team: Treatment Team: Attending Provider: Dagoberto Boston, MD; Charge Nurse: Arminda Resides, RN; Registered Nurse: Kai Levins, RN; Student Nurse: Cherie Dark, Madelaine Bhat; Utilization Review: Fortino Sic, RN ? ? ?Problem List: Principal Problem: ?  Cancer of sigmoid ([place]) ?Active Problems: ?  Dyslipidemia ?  Obesity (BMI 30.0-34.9) ? ? ? ? ?1 Day Post-Op  10/19/2021 ? ?POST-OPERATIVE DIAGNOSIS:   ?PROXIMAL SIGMOID CANCER  ?INCARCERATED UMBILICAL HERNIA ?  ?PROCEDURE:   ?ROBOTIC LEFT HEMI COLECTOMY ?MOBILIZATION SPLENIC FLEXURE OF COLON ?PRIMARY UMBILICAL HERNIA REPAIR,   ?INTRAOPERATIVE ASSESSMENT OF TISSUE VASCULAR PERFUSION USING ICG (indocyanine green) IMMUNOFLUORESCENCE ?TRANSVERSUS ABDOMINIS PLANE (TAP) BLOCK - BILATERAL ?RIGID PROCTOSCOPY ?  ?SURGEON:  Adin Hector, MD ? ?OR FINDINGS:  ?  ?Patient had bulky mass in the proximal sigmoid colon just proximal to tattooing. ?No obvious metastatic disease on visceral parietal peritoneum or liver. ?It is a 31 end splenic flexure to end rectosigmoid EEA anastomosis that rests 16-17 cm from the anal verge by rigid proctoscopy. ?  ?Patient had a 2 x 1.5 cm umbilical hernia incarcerated with some omentum.  Reduced and primarily repaired transversely with #1 PDS ? ? ? ? ? ? ? ?Assessment ? ?Recovering well so far ? ?(Hospital Stay = 1 days) ? ?Plan: ? ?-Follow-up on pathology. ?-ERAS enhance recovery pathway ?-Stop IV fluids with as needed backup. ?-Advanced to full/dysphagia 1 pur?ed diet.  Most likely can advance to heart healthy diet tonight if doing  well ?Hemoglobin stable.  Follow ?Mildly borderline hypokalemia.  Replace and check tomorrow. ?-VTE prophylaxis- SCDs, etc ?-mobilize as tolerated to help recovery ? ?Disposition:  ?Disposition:  ?The patient is from: Home ? ?Anticipate discharge to:  Home ? ?Anticipated Date of Discharge is:  March 18,2023 ?  ? ?Barriers to discharge:  Pending Clinical improvement (more likely than not) ? ?Patient currently is NOT MEDICALLY STABLE for discharge from the hospital from a surgery standpoint. ? ? ? ? ? ?I reviewed last 24 h vitals and pain scores, last 48 h intake and output, last 24 h labs and trends, and last 24 h imaging results. I have reviewed this patient's available data, including medical history, events of note, test results, etc as part of my evaluation.  A significant portion of that time was spent in counseling.  Care during the described time interval was provided by me. ? ?This care required moderate level of medical decision making.  10/20/2021 ? ? ? ?Subjective: ?(Chief complaint) ? ?Patient using CPAP with baseline at home. ? ?Denies much pain. ? ?Tolerated liquids. ? ?Objective: ? ?Vital signs: ? ?Vitals:  ? 10/19/21 1844 10/19/21 2040 10/20/21 0122 10/20/21 0510  ?BP: 101/73 122/76 114/73 118/74  ?Pulse: 86 85 86 71  ?Resp: '18 19 18 18  '$ ?Temp: 97.7 ?F (36.5 ?C) 99.1 ?F (37.3 ?C) 99.1 ?F (37.3 ?C) 98.2 ?F (36.8 ?C)  ?TempSrc: Oral Oral Axillary Oral  ?SpO2: 97% 96% 97% 99%  ?Weight:      ?Height:      ? ? ?Last BM Date : 10/19/21 ? ?Intake/Output  ? ?Yesterday: ? 03/16 0701 - 03/17 0700 ?In: 3798.8 [P.O.:880; I.V.:2818.8; IV Piggyback:100] ?  Out: 650 [Urine:650] ?This shift: ? No intake/output data recorded. ? ?Bowel function: ? Flatus: YES ? BM:  No ? Drain: (No drain) ? ? ?Physical Exam: ? ?General: Pt awake/alert in no acute distress ?Eyes: PERRL, normal EOM.  Sclera clear.  No icterus ?Neuro: CN II-XII intact w/o focal sensory/motor deficits. ?Lymph: No head/neck/groin lymphadenopathy ?Psych:  No  delerium/psychosis/paranoia.  Oriented x 4 ?HENT: Normocephalic, Mucus membranes moist.  No thrush ?Neck: Supple, No tracheal deviation.  No obvious thyromegaly ?Chest: No pain to chest wall compression.  Good respiratory excursion.  No audible wheezing ?CV:  Pulses intact.  Regular rhythm.  No major extremity edema ?MS: Normal AROM mjr joints.  No obvious deformity ? ?Abdomen: Soft.  Nondistended.  Mildly tender at incisions only.  No evidence of peritonitis.  No incarcerated hernias. ? ?Ext:   No deformity.  No mjr edema.  No cyanosis ?Skin: No petechiae / purpurea.  No major sores.  Warm and dry ? ? ? ?Results:  ? ?Cultures: ?Recent Results (from the past 720 hour(s))  ?SARS CORONAVIRUS 2 (TAT 6-24 HRS) Nasopharyngeal Nasopharyngeal Swab     Status: None  ? Collection Time: 10/17/21  1:40 PM  ? Specimen: Nasopharyngeal Swab  ?Result Value Ref Range Status  ? SARS Coronavirus 2 NEGATIVE NEGATIVE Final  ?  Comment: (NOTE) ?SARS-CoV-2 target nucleic acids are NOT DETECTED. ? ?The SARS-CoV-2 RNA is generally detectable in upper and lower ?respiratory specimens during the acute phase of infection. Negative ?results do not preclude SARS-CoV-2 infection, do not rule out ?co-infections with other pathogens, and should not be used as the ?sole basis for treatment or other patient management decisions. ?Negative results must be combined with clinical observations, ?patient history, and epidemiological information. The expected ?result is Negative. ? ?Fact Sheet for Patients: ?SugarRoll.be ? ?Fact Sheet for Healthcare Providers: ?https://www.woods-mathews.com/ ? ?This test is not yet approved or cleared by the [place] FDA and  ?has been authorized for detection and/or diagnosis of SARS-CoV-2 by ?FDA under an Emergency Use Authorization (EUA). This EUA will remain  ?in effect (meaning this test can be used) for the duration of the ?COVID-19 declaration under Se ction 564(b)(1) of  the Act, 21 U.S.C. ?section 360bbb-3(b)(1), unless the authorization is terminated or ?revoked sooner. ? ?Performed at [facility], [place] [address], [place], [place] ?[address] ?  ? ? ?Labs: ?Results for orders placed or performed during the hospital encounter of 10/19/21 (from the past 48 hour(s))  ?ABO/Rh     Status: None  ? Collection Time: 10/19/21  9:41 AM  ?Result Value Ref Range  ? ABO/RH(D)    ?  O POS ?Performed at [facility], [place] [address] [place], [place] [address] ?  ?Basic metabolic panel     Status: Abnormal  ? Collection Time: 10/20/21  4:08 AM  ?Result Value Ref Range  ? Sodium 135 135 - 145 mmol/L  ? Potassium 3.6 3.5 - 5.1 mmol/L  ? Chloride 100 98 - 111 mmol/L  ? CO2 24 22 - 32 mmol/L  ? Glucose, Bld 98 70 - 99 mg/dL  ?  Comment: Glucose reference range applies only to samples taken after fasting for at least 8 hours.  ? BUN 14 6 - 20 mg/dL  ? Creatinine, Ser 1.01 0.61 - 1.24 mg/dL  ? Calcium 8.3 (L) 8.9 - 10.3 mg/dL  ? GFR, Estimated >60 >60 mL/min  ?  Comment: (NOTE) ?Calculated using the CKD-EPI Creatinine Equation (2021) ?  ?  Anion gap 11 5 - 15  ?  Comment: Performed at [facility], [place] [address] [place], [place] [address]  ?CBC     Status: Abnormal  ? Collection Time: 10/20/21  4:08 AM  ?Result Value Ref Range  ? WBC 10.8 (H) 4.0 - 10.5 K/uL  ? RBC 4.23 4.22 - 5.81 MIL/uL  ? Hemoglobin 12.7 (L) 13.0 - 17.0 g/dL  ? HCT 40.1 39.0 - 52.0 %  ? MCV 94.8 80.0 - 100.0 fL  ? MCH 30.0 26.0 - 34.0 pg  ? MCHC 31.7 30.0 - 36.0 g/dL  ? RDW 12.7 11.5 - 15.5 %  ? Platelets 266 150 - 400 K/uL  ? nRBC 0.0 0.0 - 0.2 %  ?  Comment: Performed at [facility], [place] [address] [place], [place] [address]  ?Magnesium     Status: None  ? Collection Time: 10/20/21  4:08 AM  ?Result Value Ref Range  ? Magnesium 1.8 1.7 - 2.4 mg/dL  ?  Comment: Performed at [facility], [place] [address], [place], [place] [address]  ? ? ?Imaging /  Studies: ?No results found. ? ?Medications / Allergies: per chart ? ?Antibiotics: ?Anti-infectives (From admission, onward)  ? ? Start     Dose/Rate Route Frequency Ordered Stop  ? 10/19/21 2300  cefoTEt

## 2021-10-20 NOTE — Progress Notes (Signed)
Transition of Care (TOC) Screening Note ? ?Patient Details  ?Name: Jimmy [name] ?Date of Birth: 1971/03/26 ? ?Transition of Care (TOC) CM/SW Contact:    ?Sherie Don, LCSW ?Phone Number: ?10/20/2021, 11:00 AM ? ?Transition of Care Department [facility]) has reviewed patient and no TOC needs have been identified at this time. We will continue to monitor patient advancement through interdisciplinary progression rounds. If new patient transition needs arise, please place a TOC consult. ?

## 2021-10-21 LAB — POTASSIUM: Potassium: 3.7 mmol/L (ref 3.5–5.1)

## 2021-10-21 LAB — HEMOGLOBIN: Hemoglobin: 12.2 g/dL — ABNORMAL LOW (ref 13.0–17.0)

## 2021-10-21 NOTE — Progress Notes (Signed)
? ? ?  Assessment & Plan: ?POD#2 - ROBOTIC LEFT HEMI COLECTOMY, MOBILIZATION SPLENIC FLEXURE OF COLON, PRIMARY UMBILICAL HERNIA REPAIR - Dr. Johney Maine ? Beginning soft diet this AM ? Moderate pain - currently controlled ? Encouraged OOB, ambulation ? Probable discharge in AM 3/19 ? ?      Jimmy Gemma, MD ?      [facility] ?      Office: [phone] ? ? ?Chief Complaint: ?Colon cancer ? ?Subjective: ?Patient in bed, mild pain.  Passing flatus and liquid BM's. ? ?Objective: ?Vital signs in last 24 hours: ?Temp:  [97.6 ?F (36.4 ?C)-98.9 ?F (37.2 ?C)] 98.3 ?F (36.8 ?C) (03/18 0754) ?Pulse Rate:  [62-74] 62 (03/18 0754) ?Resp:  [14-20] 20 (03/18 0754) ?BP: (99-115)/(63-76) 105/69 (03/18 0754) ?SpO2:  [94 %-97 %] 97 % (03/18 0754) ?Last BM Date : 10/19/21 ? ?Intake/Output from previous day: ?03/17 0701 - 03/18 0700 ?In: 926 [P.O.:920; I.V.:6] ?Out: 2550 [Urine:2550] ?Intake/Output this shift: ?No intake/output data recorded. ? ?Physical Exam: ?HEENT - sclerae clear, mucous membranes moist ?Neck - soft ?Abdomen - soft, mild distension; wounds dry and intact; BS present ?Ext - no edema, non-tender ?Neuro - alert & oriented, no focal deficits ? ?Lab Results:  ?Recent Labs  ?  10/20/21 ?0408 10/21/21 ?0403  ?WBC 10.8*  --   ?HGB 12.7* 12.2*  ?HCT 40.1  --   ?PLT 266  --   ? ?BMET ?Recent Labs  ?  10/20/21 ?0408 10/21/21 ?0403  ?NA 135  --   ?K 3.6 3.7  ?CL 100  --   ?CO2 24  --   ?GLUCOSE 98  --   ?BUN 14  --   ?CREATININE 1.01  --   ?CALCIUM 8.3*  --   ? ?PT/INR ?No results for input(s): LABPROT, INR in the last 72 hours. ?Comprehensive Metabolic Panel: ?   ?Component Value Date/Time  ? NA 135 10/20/2021 0408  ? NA 138 10/05/2018 1714  ? K 3.7 10/21/2021 0403  ? K 3.6 10/20/2021 0408  ? CL 100 10/20/2021 0408  ? CL 106 10/05/2018 1714  ? CO2 24 10/20/2021 0408  ? CO2 25 10/05/2018 1714  ? BUN 14 10/20/2021 0408  ? BUN 16 10/05/2018 1714  ? CREATININE 1.01 10/20/2021 0408  ? CREATININE 0.86 10/05/2018 1714  ?  GLUCOSE 98 10/20/2021 0408  ? GLUCOSE 119 (H) 10/05/2018 1714  ? CALCIUM 8.3 (L) 10/20/2021 0408  ? CALCIUM 8.9 10/05/2018 1714  ? AST 26 10/05/2018 1714  ? ALT 18 10/05/2018 1714  ? ALKPHOS 69 10/05/2018 1714  ? BILITOT 0.5 10/05/2018 1714  ? PROT 7.1 10/05/2018 1714  ? ALBUMIN 4.1 10/05/2018 1714  ? ? ?Studies/Results: ?No results found. ? ? ? ?Jimmy [name] ?10/21/2021 ? ? Patient ID: Jimmy [name], male   DOB: June 26, 1971, 51 y.o.   MRN: [id number] ? ?

## 2021-10-22 NOTE — Progress Notes (Signed)
Discharge instructions given to patient and all questions were answered.  

## 2021-10-22 NOTE — Progress Notes (Signed)
Patient ID: Jimmy [name], male   DOB: 12-02-1970, 51 y.o.   MRN: [id number] ? ? ?Has some incisional pain, but controlled ?Had GM ?Tolerated po ?Abdomen soft ?Wants to go home ? ?Plan: ?Discharge home ?

## 2021-10-23 NOTE — Discharge Summary (Signed)
Physician Discharge Summary  ? ? ?Patient ID: ?Jimmy [name] ?MRN: [id number] ?DOB/AGE: 08-08-1970  ?51 y.o. ? ?Patient Care Team: ?Enid Skeens., MD as PCP - General (Family Medicine) ?Neizan Boston, MD as Consulting Physician (General Surgery) ?Wilford Corner, MD as Consulting Physician (Gastroenterology) ?Revankar, Reita Cliche, MD as Consulting Physician (Cardiology) ? ?Admit date: 10/19/2021 ? ?Discharge date: 10/22/2021 ?Hospital Stay = 3 days ? ? ? ?Discharge Diagnoses:  ?Principal Problem: ?  Cancer of sigmoid ([place]) ?Active Problems: ?  Dyslipidemia ?  Obesity (BMI 30.0-34.9) ? ? ?4 Days Post-Op  10/19/2021 ? ?POST-OPERATIVE DIAGNOSIS:  ? ?SIGMOID CANCER ? ?SURGERY:  10/19/2021 ? ?Procedure(s): ?ROBOTIC LEFT HEMI COLECTOMY, MOBILIZATION SPLENIC FLEXURE OF COLON, PRIMARY UMBILICAL HERNIA REPAIR,  ASSESSMENT OF PERFUSION USING FIREFLY, AND TAP BLOCK ?RIGIDI PROCTOSCOPY ? ?SURGEON:   ? ?Surgeon(s): ?Owenn Boston, MD ?Leighton Ruff, MD ? ?Consults: Case Management / Social Work ? ?Hospital Course:  ? ?The patient underwent the surgery above.  Postoperatively, the patient gradually mobilized and advanced to a solid diet.  Pain and other symptoms were treated aggressively.   ? ?By the time of discharge, the patient was walking well the hallways, eating food, having flatus.  Pain was well-controlled on an oral medications.  Based on meeting discharge criteria and continuing to recover, I felt it was safe for the patient to be discharged from the hospital to further recover with close followup. Postoperative recommendations were discussed in detail.  They are written as well. ? ?Discharged Condition: good ? ?Discharge Exam: ?Blood pressure 118/73, pulse 83, temperature 97.9 ?F (36.6 ?C), temperature source Oral, resp. rate 16, height '6\' 2"'$  (1.88 m), weight 106.4 kg, SpO2 98 %. ? ?General: Pt awake/alert/oriented x4 in No acute distress ?Eyes: PERRL, normal EOM.  Sclera clear.  No icterus ?Neuro: CN II-XII intact w/o  focal sensory/motor deficits. ?Lymph: No head/neck/groin lymphadenopathy ?Psych:  No delerium/psychosis/paranoia ?HENT: Normocephalic, Mucus membranes moist.  No thrush ?Neck: Supple, No tracheal deviation ?Chest:  No chest wall pain w good excursion ?CV:  Pulses intact.  Regular rhythm ?MS: Normal AROM mjr joints.  No obvious deformity ?Abdomen: Soft.  Nondistended.  Mildly tender at incisions only.  No evidence of peritonitis.  No incarcerated hernias. ?Ext:  SCDs BLE.  No mjr edema.  No cyanosis ?Skin: No petechiae / purpura ? ? ?Disposition:  ? ? Follow-up Information   ? ? Saud Boston, MD Follow up in 3 week(s).   ?Specialties: General Surgery, Colon and Rectal Surgery ?Why: To follow up after your operation ?Contact information: ?[address] ?[address] ?[place] [address] ?[phone] ? ? ?  ?  ? ?  ?  ? ?  ? ? ?Discharge disposition: 01-Home or Self Care ? ? ? ? ? ? ?Discharge Instructions   ? ? Call MD for:   Complete by: As directed ?  ? FEVER > 101.5 F  ?(temperatures < 101.5 F are not significant)  ? Call MD for:  extreme fatigue   Complete by: As directed ?  ? Call MD for:  persistant dizziness or light-headedness   Complete by: As directed ?  ? Call MD for:  persistant nausea and vomiting   Complete by: As directed ?  ? Call MD for:  redness, tenderness, or signs of infection (pain, swelling, redness, odor or green/yellow discharge around incision site)   Complete by: As directed ?  ? Call MD for:  severe uncontrolled pain   Complete by: As directed ?  ?  Diet - low sodium heart healthy   Complete by: As directed ?  ? Start with a bland diet such as soups, liquids, starchy foods, low fat foods, etc. the first few days at home. ?Gradually advance to a solid, low-fat, high fiber diet by the end of the first week at home.   ?Add a fiber supplement to your diet (Metamucil, etc) ?If you feel full, bloated, or constipated, stay on a full liquid or pureed/blenderized diet for a few days until you feel  better and are no longer constipated.  ? Discharge instructions   Complete by: As directed ?  ? See Discharge Instructions ?If you are not getting better after two weeks or are noticing you are getting worse, contact our office [phone] for further advice.  We may need to adjust your medications, re-evaluate you in the office, send you to the emergency room, or see what other things we can do to help. ?The clinic staff is available to answer your questions during regular business hours (8:30am-5pm).  Please don't hesitate to call and ask to speak to one of our nurses for clinical concerns.    ?A surgeon from [facility] is always on call at the hospitals 24 hours/day ?If you have a medical emergency, go to the nearest emergency room or call 911.  ? Discharge wound care:   Complete by: As directed ?  ? It is good for closed incisions and even open wounds to be washed every day.  Shower every day.  Short baths are fine.  Wash the incisions and wounds clean with soap & water.    ?You may leave closed incisions open to air if it is dry.   You may cover the incision with clean gauze & replace it after your daily shower for comfort. ? ?TEGADERM:  You have clear gauze band-aid dressings over your closed incision(s).  Remove the dressings 3 days after surgery = 3/19  ? Driving Restrictions   Complete by: As directed ?  ? You may drive when: ?- you are no longer taking narcotic prescription pain medication ?- you can comfortably wear a seatbelt ?- you can safely make sudden turns/stops without pain.  ? Increase activity slowly   Complete by: As directed ?  ? Start light daily activities --- self-care, walking, climbing stairs- beginning the day after surgery.  Gradually increase activities as tolerated.  Control your pain to be active.  Stop when you are tired.  Ideally, walk several times a day, eventually an hour a day.   ?Most people are back to most day-to-day activities in a few weeks.  It takes 4-6  weeks to get back to unrestricted, intense activity. ?If you can walk 30 minutes without difficulty, it is safe to try more intense activity such as jogging, treadmill, bicycling, low-impact aerobics, swimming, etc. ?Save the most intensive and strenuous activity for last (Usually 4-8 weeks after surgery) such as sit-ups, heavy lifting, contact sports, etc.  Refrain from any intense heavy lifting or straining until you are off narcotics for pain control.  You will have off days, but things should improve week-by-week. ?DO NOT PUSH THROUGH PAIN.  Let pain be your guide: If it hurts to do something, don't do it.  ? Lifting restrictions   Complete by: As directed ?  ? If you can walk 30 minutes without difficulty, it is safe to try more intense activity such as jogging, treadmill, bicycling, low-impact aerobics, swimming, etc. ?Save the most intensive  and strenuous activity for last (Usually 4-8 weeks after surgery) such as sit-ups, heavy lifting, contact sports, etc.   ?Refrain from any intense heavy lifting or straining until you are off narcotics for pain control.  You will have off days, but things should improve week-by-week. ?DO NOT PUSH THROUGH PAIN.  Let pain be your guide: If it hurts to do something, don't do it.  Pain is your body warning you to avoid that activity for another week until the pain goes down.  ? May shower / Bathe   Complete by: As directed ?  ? May walk up steps   Complete by: As directed ?  ? Remove dressing in 72 hours   Complete by: As directed ?  ? Make sure all dressings are removed by the third day after surgery.  Leave incisions open to air.  OK to cover incisions with gauze or bandages as desired  ? Sexual Activity Restrictions   Complete by: As directed ?  ? You may have sexual intercourse when it is comfortable. If it hurts to do something, stop.  ? ?  ? ? ?Allergies as of 10/22/2021   ?No Known Allergies ?  ? ?  ?Medication List  ?  ? ?TAKE these medications   ? ?amitriptyline 50 MG  tablet ?Commonly known as: ELAVIL ?Take 50 mg by mouth at bedtime. ?  ?sertraline 100 MG tablet ?Commonly known as: ZOLOFT ?Take 100 mg by mouth every evening. ?  ?testosterone cypionate 200 MG/ML in

## 2021-10-24 LAB — SURGICAL PATHOLOGY

## 2021-11-01 ENCOUNTER — Other Ambulatory Visit: Payer: Self-pay

## 2021-11-01 NOTE — Progress Notes (Signed)
Chart rreview ?

## 2021-11-01 NOTE — Progress Notes (Signed)
I spoke with Jimmy [name] regarding consultation with Dr Burr Medico. He has agreed to appt on 11/08/2021 at 1500.  I requested he arrive 15 minutes early for his appt for registration purposes.  He is aware of our location and our free [place] parking.  I explained my role as a nurse navigator and provided my contact information. All questions were answered.  *He verbalized understanding. ? ? ?

## 2021-11-01 NOTE — Progress Notes (Signed)
The proposed treatment discussed in conference is for discussion purpose only and is not a binding recommendation.  The patients have not been physically examined, or presented with their treatment options.  Therefore, final treatment plans cannot be decided.  

## 2021-11-08 ENCOUNTER — Other Ambulatory Visit: Payer: Self-pay

## 2021-11-08 ENCOUNTER — Encounter: Payer: Self-pay | Admitting: Hematology

## 2021-11-08 ENCOUNTER — Inpatient Hospital Stay: Payer: 59 | Attending: Hematology | Admitting: Hematology

## 2021-11-08 ENCOUNTER — Inpatient Hospital Stay: Payer: 59

## 2021-11-08 VITALS — BP 132/87 | HR 71 | Temp 98.0°F | Resp 18 | Ht 74.0 in | Wt 229.6 lb

## 2021-11-08 DIAGNOSIS — C187 Malignant neoplasm of sigmoid colon: Secondary | ICD-10-CM | POA: Diagnosis present

## 2021-11-08 DIAGNOSIS — Z9049 Acquired absence of other specified parts of digestive tract: Secondary | ICD-10-CM | POA: Diagnosis not present

## 2021-11-08 DIAGNOSIS — Z8052 Family history of malignant neoplasm of bladder: Secondary | ICD-10-CM | POA: Diagnosis not present

## 2021-11-08 DIAGNOSIS — Z5111 Encounter for antineoplastic chemotherapy: Secondary | ICD-10-CM | POA: Diagnosis present

## 2021-11-08 DIAGNOSIS — K769 Liver disease, unspecified: Secondary | ICD-10-CM | POA: Diagnosis not present

## 2021-11-08 DIAGNOSIS — C779 Secondary and unspecified malignant neoplasm of lymph node, unspecified: Secondary | ICD-10-CM | POA: Diagnosis not present

## 2021-11-08 DIAGNOSIS — C787 Secondary malignant neoplasm of liver and intrahepatic bile duct: Secondary | ICD-10-CM | POA: Diagnosis not present

## 2021-11-08 LAB — CBC WITH DIFFERENTIAL/PLATELET
Abs Immature Granulocytes: 0.01 10*3/uL (ref 0.00–0.07)
Basophils Absolute: 0 10*3/uL (ref 0.0–0.1)
Basophils Relative: 1 %
Eosinophils Absolute: 0.3 10*3/uL (ref 0.0–0.5)
Eosinophils Relative: 6 %
HCT: 41.1 % (ref 39.0–52.0)
Hemoglobin: 13.5 g/dL (ref 13.0–17.0)
Immature Granulocytes: 0 %
Lymphocytes Relative: 25 %
Lymphs Abs: 1.3 10*3/uL (ref 0.7–4.0)
MCH: 30.1 pg (ref 26.0–34.0)
MCHC: 32.8 g/dL (ref 30.0–36.0)
MCV: 91.5 fL (ref 80.0–100.0)
Monocytes Absolute: 0.7 10*3/uL (ref 0.1–1.0)
Monocytes Relative: 13 %
Neutro Abs: 2.8 10*3/uL (ref 1.7–7.7)
Neutrophils Relative %: 55 %
Platelets: 314 10*3/uL (ref 150–400)
RBC: 4.49 MIL/uL (ref 4.22–5.81)
RDW: 12.8 % (ref 11.5–15.5)
WBC: 5 10*3/uL (ref 4.0–10.5)
nRBC: 0 % (ref 0.0–0.2)

## 2021-11-08 LAB — CMP (CANCER CENTER ONLY)
ALT: 32 U/L (ref 0–44)
AST: 25 U/L (ref 15–41)
Albumin: 4 g/dL (ref 3.5–5.0)
Alkaline Phosphatase: 63 U/L (ref 38–126)
Anion gap: 7 (ref 5–15)
BUN: 11 mg/dL (ref 6–20)
CO2: 26 mmol/L (ref 22–32)
Calcium: 8.8 mg/dL — ABNORMAL LOW (ref 8.9–10.3)
Chloride: 106 mmol/L (ref 98–111)
Creatinine: 0.84 mg/dL (ref 0.61–1.24)
GFR, Estimated: >60 mL/min (ref >60)
Glucose, Bld: 103 mg/dL — ABNORMAL HIGH (ref 70–99)
Potassium: 3.9 mmol/L (ref 3.5–5.1)
Sodium: 139 mmol/L (ref 135–145)
Total Bilirubin: 0.2 mg/dL — ABNORMAL LOW (ref 0.3–1.2)
Total Protein: 7.1 g/dL (ref 6.5–8.1)

## 2021-11-08 MED ORDER — PROCHLORPERAZINE MALEATE 10 MG PO TABS: 10.0000 mg | ORAL_TABLET | Freq: Four times a day (QID) | ORAL | 1 refills | Status: DC | PRN

## 2021-11-08 MED ORDER — ONDANSETRON HCL 8 MG PO TABS: 8.0000 mg | ORAL_TABLET | Freq: Two times a day (BID) | ORAL | 1 refills | Status: DC | PRN

## 2021-11-08 MED ORDER — CAPECITABINE 500 MG PO TABS
850.0000 mg/m2 | ORAL_TABLET | Freq: Two times a day (BID) | ORAL | 1 refills | Status: DC
  Filled 2021-11-08: qty 112, 14d supply, fill #0

## 2021-11-08 NOTE — Progress Notes (Signed)
START ON PATHWAY REGIMEN - Colorectal ? ? ?  A cycle is every 21 days: ?    Capecitabine  ?    Oxaliplatin  ? ?**Always confirm dose/schedule in your pharmacy ordering system** ? ?Patient Characteristics: ?Postoperative without Neoadjuvant Therapy (Pathologic Staging), Colon, Stage III, Low Risk (pT1-3, pN1) ?Tumor Location: Colon ?Therapeutic Status: Postoperative without Neoadjuvant Therapy (Pathologic Staging) ?AJCC M Category: cM0 ?AJCC T Category: pT3 ?AJCC N Category: pN1b ?AJCC 8 Stage Grouping: IIIB ?Intent of Therapy: ?Curative Intent, Discussed with Patient ?

## 2021-11-08 NOTE — Progress Notes (Signed)
I met with Jimmy [name] and his wife after  his consultation with Dr Burr Medico.  I explained my role as a nurse navigator and provided my contact information. I explained the alight grant and let  them know one of the financial advisors will reach out to  him at the time of his chemo education class. I told them he will be scheduled for chemotherapy education class prior to receiving chemotherapy.  I told them our schedulers will call him with those appts.  All questions were answered.  He verbalized understanding. ? ?

## 2021-11-08 NOTE — Progress Notes (Signed)
?[facility]   ?Telephone:([phone] [phone]   ?Clinic New Consult Note  ? ?Patient Care Team: ?Enid Skeens., MD as PCP - General (Family Medicine) ?Regino Boston, MD as Consulting Physician (General Surgery) ?Wilford Corner, MD as Consulting Physician (Gastroenterology) ?Revankar, Reita Cliche, MD as Consulting Physician (Cardiology) ? ?Date of Service:  11/08/2021  ? ?CHIEF COMPLAINTS/PURPOSE OF CONSULTATION:  ?Colon Cancer ? ?REFERRING PHYSICIAN:  ?Dr. Johney Maine ? ?ASSESSMENT & PLAN:  ?MONTRE HARBOR is a 51 y.o. male with ? ?1. Cancer of Sigmoid Colon, moderately differentiated adenocarcinoma, stage IIIB p(T3, N1b), MMR normal  ?-presented with intermittent rectal bleeding for 6 months, first colonoscopy on 08/14/21 by Dr. Michail Sermon showed sigmoid colon mass, biopsy confirmed adenocarcinoma. ?-baseline CEA on 09/12/21 was WNL. ?-CT CAP on 09/20/21 showed: wall thickening of sigmoid colon; nonspecific but suspicious sigmoid mesentery lymph nodes; two indeterminate tiny hepatic lesions; otherwise no definite metastatic disease.  ?-left hemicolectomy on 10/19/21 by Dr. Johney Maine showed 4 cm adenocarcinoma, grade 2 (moderately differentiated), invasive through muscularis propria into pericolonic tissue; margins negative; metastatic carcinoma in two lymph nodes (2/18).  No lymphovascular or perineural invasion. ?-we reviewed the work up and findings thus far.  I discussed the high risk of recurrence due to his stage IIIb disease.  I explained that the standard of care for stage III colon cancer is adjuvant chemotherapy with either CAPOX (IV oxaliplatin and oral Xeloda) or FOLFOX (IV oxaliplatin and 5FU pump). I discussed the two regimen and the expected side effects of each. He is most interested in CAPOX and would like to forego port placement at this time.  Since he does not have T4 or N2 disease, I recommend adjuvant therapy for 3 months. ?--Chemotherapy consent: Side effects including but does  not not limited to, fatigue, nausea, vomiting, diarrhea, hair loss, cold sensitivity and neuropathy, fluid retention, renal and kidney dysfunction, neutropenic fever, needed for blood transfusion, bleeding, were discussed with patient in great detail. He agrees to proceed. ?-The goal of chemotherapy is curative. ?-I also recommend obtaining abdominal MRI to further evaluate the 2 small liver lesions. They are agreeable. We will also obtain baseline labs. ?-I discussed the risk of cancer recurrence in the future. I discussed the surveillance plan, which is a physical exam and lab test (including CBC, CMP and CEA) every 3 months for the first 2 years, then every 6-12 months, colonoscopy in one year, and surveilliance CT scan every 6-12 month for up to 5 year.  ?-He will meet our nurse navigator Santiago Glad today and be contacted by pharmacist Wells Guiles in the next few days. ? ? ?PLAN:  ?-lab today ?-chemo education ?-liver MRI to be done soon ?-I called in Xeloda, will not start until first chemo infusion  ?-lab, f/u, and start CAPOX in 2 weeks, then every 3 weeks X3 ?-phone visit for toxicity check in 3 weeks ? ? ?Oncology History  ?Cancer of sigmoid colon ([place])  ?08/14/2021 Procedure  ? Colonoscopy, Dr. Michail Sermon ? ?Impression: ? - Malignant partially obstructing tumor in the sigmoid colon. Biopsied. Tattooed. ?  ?08/14/2021 Initial Biopsy  ? INVASIVE MODERATELY DIFFERENTIATED ADENOCARCINOMA ?  ?09/20/2021 Imaging  ? EXAM: ?CT CHEST, ABDOMEN, AND PELVIS WITH CONTRAST ? ?IMPRESSION: ?1. Short segment focal wall thickening of the sigmoid colon, ?possibly reflecting patient's newly diagnosed colon cancer. ?2. Prominent lymph nodes in the sigmoid mesentery, adjacent to the wall thickening, the largest of which measures 5 mm in short axis are nonspecific but suspicious for early  nodal involvement. ?3. Two tiny hypodense hepatic lesions measuring up to 6 mm, the ?larger of which was present on prior study dating back to January ?27,  2021, but the 4 mm is new since then, and both of which are too small to accurately characterize. While these almost certainly ?reflect a benign etiology, given that the 4 mm lesion in the right ?lobe of liver is new from prior, a hepatic metastases in this ?patient with colon cancer can not be completely excluded. Suggest ?attention on follow-up imaging. ?4. Otherwise, no definite evidence of distant metastatic disease in ?the chest, abdomen or pelvis. ?5. Diffuse hepatic steatosis. ?6. Punctate nonobstructive left lower pole renal calculus. ?7. Colonic diverticulosis without findings of acute diverticulitis. ?  ?10/19/2021 Initial Diagnosis  ? Cancer of sigmoid [facility]) ?  ?10/19/2021 Cancer Staging  ? Staging form: Colon and Rectum, AJCC 8th Edition ?- Pathologic stage from 10/19/2021: Stage IIIB (pT3, pN1b, cM0) - Signed by Truitt Merle, MD on 11/06/2021 ?Histologic grading system: 4 grade system ?Histologic grade (G): G2 ?Residual tumor (R): R0 - None ? ?  ?10/19/2021 Definitive Surgery  ? FINAL MICROSCOPIC DIAGNOSIS:  ? ?A.   LEFT COLON, RESECTION:  ?-    Adenocarcinoma, grade 2 (moderately differentiated), 4 cm in  ?greatest dimension, invasive through muscularis propria into pericolonic tissue.  ?-    All margins negative for tumor.  ?-    Metastatic carcinoma present in two of eighteen regional lymph  ?nodes (2/18).  ? ?B.   FINAL DISTAL MARGIN, EXCISION:  ?-    Negative for adenomatous change and malignancy.  ?  ?11/22/2021 -  Chemotherapy  ? Patient is on Treatment Plan : COLORECTAL Xelox (Capeox) q21d  ?   ? ? ? ?HISTORY OF PRESENTING ILLNESS:  ?GEARALD STONEBRAKER 51 y.o. male is a here because of colon cancer. The patient was referred by Dr. Johney Maine. The patient presents to the clinic today accompanied by his wife. ? ? ?He presented for routine screening colonoscopy with occasional hematochezia. He denies having any constipation, abdominal pain, or other symptoms. Colonoscopy was performed by Dr. Michail Sermon on 08/14/21 and  showed a tumor in the sigmoid colon.  ? ?He was referred to Dr. Johney Maine on 09/12/21. Baseline CEA performed at that visit was WNL at 2.8. He also underwent staging CT CAP on 09/20/21 showing: focal wall thickening of sigmoid colon; prominent lymph nodes in sigmoid mesentery, adjacent to wall thickening, nonspecific but suspicious; two tiny indeterminate hepatic lesions. ? ?He proceeded to left hemicolectomy on 10/19/21 under Dr. Johney Maine. Pathology from the procedure showed: adenocarcinoma, grade 2 (moderately differentiated), 4 cm, invading through muscularis propria into pericolonic tissue; margins negative; metastatic carcinoma in 2 of 18 lymph nodes. ? ? ?Today the patient notes they felt/feeling prior/after... ?-reports he is 75-80% recovered ?-weight loss of ~20 lbs around surgery. ? ?He has a PMHx of.... ?-sleep apnea, uses CPAP ?-anxiety, on sertraline ?-kidney stones ?-no prior surgeries ? ?Socially... ?-he works for a [employer] that [employer]. ?-married with 2 children, son is 27 and daughter is 83 ?-he reports bladder cancer in his father around age 54. Aside from non-cancerous colon polyps in his mother, he denies any additional family history of colon or any other cancer. ? ?REVIEW OF SYSTEMS:    ?Constitutional: Denies fevers, chills or abnormal night sweats ?Eyes: Denies blurriness of vision, double vision or watery eyes ?Ears, nose, mouth, throat, and face: Denies mucositis or sore throat ?Respiratory: Denies cough, dyspnea or wheezes ?  Cardiovascular: Denies palpitation, chest discomfort or lower extremity swelling ?Gastrointestinal:  Denies nausea, heartburn or change in bowel habits ?Skin: Denies abnormal skin rashes ?Lymphatics: Denies new lymphadenopathy or easy bruising ?Neurological:Denies numbness, tingling or new weaknesses ?Behavioral/Psych: Mood is stable, no new changes  ?All other systems were reviewed with the patient and are negative. ? ? ?MEDICAL HISTORY:  ?Past Medical  History:  ?Diagnosis Date  ? Anxiety   ? GERD (gastroesophageal reflux disease)   ? History of kidney stones   ? Sleep apnea   ? ? ?SURGICAL HISTORY: ?Past Surgical History:  ?Procedure Laterality Date  ? EXTRA

## 2021-11-09 ENCOUNTER — Telehealth: Payer: Self-pay | Admitting: Pharmacist

## 2021-11-09 ENCOUNTER — Encounter: Payer: Self-pay | Admitting: Hematology

## 2021-11-09 ENCOUNTER — Other Ambulatory Visit (HOSPITAL_COMMUNITY): Payer: Self-pay

## 2021-11-09 DIAGNOSIS — C187 Malignant neoplasm of sigmoid colon: Secondary | ICD-10-CM

## 2021-11-09 LAB — CEA (IN HOUSE-CHCC): CEA (CHCC-In House): 2.29 ng/mL (ref 0.00–5.00)

## 2021-11-09 LAB — FERRITIN: Ferritin: 40 ng/mL (ref 24–336)

## 2021-11-09 MED ORDER — CAPECITABINE 500 MG PO TABS: 850.0000 mg/m2 | ORAL_TABLET | Freq: Two times a day (BID) | ORAL | 1 refills | Status: DC

## 2021-11-09 NOTE — Telephone Encounter (Signed)
Oral Oncology Pharmacist Encounter ? ?Received new prescription for Xeloda (capecitabine) for the treatment of stage IIIB colon cancer in conjunction with oxaliplatin, planned duration 3 months. ? ?CMP and CBC w/ Diff from 11/08/21 assessed, no relevant lab abnormalities noted. Prescription dose and frequency assessed for appropriateness.  ? ?Current medication list in Epic reviewed, DDIs with Xeloda identified: ?Category C DDI between Xeloda and Ondansetron due to risk of Qtc prolongation with fluorouracil products. Noted patient only taking PRN and PO route, risk higher with IV administration. No change in therapy warranted at this time.  ? ?Evaluated chart and no patient barriers to medication adherence noted.  ? ?Patient agreement for treatment documented in MD note on 11/08/21. ? ?PA not required at this time. Patient's insurance requires Xeloda to be filled through [employer] Prescription redirected for dispensing.  ? ?Oral Oncology Clinic will continue to follow for copayment issues, initial counseling and start date. ? ?Leron Croak, PharmD, BCPS ?Hematology/Oncology Clinical Pharmacist ?[facility] Oral Chemotherapy Navigation Clinics ?[phone] ?11/09/2021 8:48 AM ? ?

## 2021-11-10 ENCOUNTER — Telehealth: Payer: Self-pay | Admitting: Hematology

## 2021-11-10 NOTE — Telephone Encounter (Signed)
Scheduled follow-up appointments per 4/5 los. Patient is aware. ?

## 2021-11-10 NOTE — Telephone Encounter (Signed)
Spoke to patient to confirm upcoming MR appointment, gave location, time and prep for procedure ?

## 2021-11-13 ENCOUNTER — Other Ambulatory Visit: Payer: 59

## 2021-11-13 NOTE — Progress Notes (Signed)
Mychart message sent regarding lab results WNL. ?

## 2021-11-14 ENCOUNTER — Emergency Department (HOSPITAL_COMMUNITY): Payer: 59

## 2021-11-14 ENCOUNTER — Emergency Department (HOSPITAL_COMMUNITY)
Admission: EM | Admit: 2021-11-14 | Discharge: 2021-11-14 | Disposition: A | Discharge status: Home / Self Care | Payer: 59 | Attending: Emergency Medicine | Admitting: Emergency Medicine

## 2021-11-14 ENCOUNTER — Encounter (HOSPITAL_COMMUNITY): Payer: Self-pay | Admitting: Emergency Medicine

## 2021-11-14 ENCOUNTER — Telehealth: Payer: Self-pay | Admitting: *Deleted

## 2021-11-14 ENCOUNTER — Inpatient Hospital Stay: Payer: 59

## 2021-11-14 ENCOUNTER — Other Ambulatory Visit: Payer: Self-pay

## 2021-11-14 DIAGNOSIS — R0789 Other chest pain: Secondary | ICD-10-CM

## 2021-11-14 DIAGNOSIS — R0602 Shortness of breath: Secondary | ICD-10-CM | POA: Insufficient documentation

## 2021-11-14 DIAGNOSIS — R072 Precordial pain: Secondary | ICD-10-CM | POA: Insufficient documentation

## 2021-11-14 HISTORY — DX: Malignant (primary) neoplasm, unspecified: C80.1

## 2021-11-14 LAB — COMPREHENSIVE METABOLIC PANEL
ALT: 39 U/L (ref 0–44)
AST: 27 U/L (ref 15–41)
Albumin: 4.5 g/dL (ref 3.5–5.0)
Alkaline Phosphatase: 72 U/L (ref 38–126)
Anion gap: 7 (ref 5–15)
BUN: 10 mg/dL (ref 6–20)
CO2: 26 mmol/L (ref 22–32)
Calcium: 9.4 mg/dL (ref 8.9–10.3)
Chloride: 105 mmol/L (ref 98–111)
Creatinine, Ser: 0.78 mg/dL (ref 0.61–1.24)
GFR, Estimated: >60 mL/min (ref >60)
Glucose, Bld: 102 mg/dL — ABNORMAL HIGH (ref 70–99)
Potassium: 3.8 mmol/L (ref 3.5–5.1)
Sodium: 138 mmol/L (ref 135–145)
Total Bilirubin: 0.7 mg/dL (ref 0.3–1.2)
Total Protein: 7.9 g/dL (ref 6.5–8.1)

## 2021-11-14 LAB — CBC
HCT: 46.3 % (ref 39.0–52.0)
Hemoglobin: 15 g/dL (ref 13.0–17.0)
MCH: 30.4 pg (ref 26.0–34.0)
MCHC: 32.4 g/dL (ref 30.0–36.0)
MCV: 93.7 fL (ref 80.0–100.0)
Platelets: 291 10*3/uL (ref 150–400)
RBC: 4.94 MIL/uL (ref 4.22–5.81)
RDW: 13 % (ref 11.5–15.5)
WBC: 10.9 10*3/uL — ABNORMAL HIGH (ref 4.0–10.5)
nRBC: 0 % (ref 0.0–0.2)

## 2021-11-14 LAB — TROPONIN I (HIGH SENSITIVITY)
Troponin I (High Sensitivity): 2 ng/L (ref <18)
Troponin I (High Sensitivity): 3 ng/L (ref <18)

## 2021-11-14 MED ORDER — IOHEXOL 350 MG/ML SOLN
100.0000 mL | Freq: Once | INTRAVENOUS | Status: AC | PRN
  Administered 2021-11-14: 80 mL via INTRAVENOUS

## 2021-11-14 MED ORDER — AMLODIPINE BESYLATE 5 MG PO TABS: 5.0000 mg | ORAL_TABLET | Freq: Every day | ORAL | 0 refills | Status: DC

## 2021-11-14 NOTE — ED Triage Notes (Signed)
Per pt, states chest pain and elevated BP-states he was at the cancer center and they sent him here for evaluation-patient states pain is constant-doesn't know if it's anxiety ?

## 2021-11-14 NOTE — Telephone Encounter (Signed)
Pt here for Pt Education & asked me to take his BP at end of session @ 5:30 pm & BP was 146/100.  P 92.  He stated that he has had some mid sternal chest pain since this am & he & wife thought it may just be anxiety/panic attack.  No radiation or SOB or perspiration.  Since provider not here, instructed pt to go to ED to be evaluated.  Message routed to Dr Burr Medico.   ?

## 2021-11-14 NOTE — Discharge Instructions (Addendum)
Take Norvasc 5 mg daily. ? ?If you have persistent chest pain, recommend stress test with cardiology ? ?Repeat blood pressure in a week with your doctor ? ?Return to ER if you have worse chest pain, trouble breathing.  ? ? ? ?

## 2021-11-14 NOTE — ED Provider Triage Note (Signed)
Emergency Medicine Provider Triage Evaluation Note ? ?Jimmy [name] , a 51 y.o. male  was evaluated in triage.  Pt complains of chest pain and mild shortness of breath. Pain began earlier this afternoon. Patient states that the pain is substernal, sharp, with no radiation. Denies nausea. Endorses mild shortness of breath. Tachycardic on monitor. Patient had colon resection in the last month due to colon cancer ? ?Review of Systems  ?Positive: Chest pain, shortness of breath ?Negative: Nausea ? ?Physical Exam  ?BP (!) 164/122 (BP Location: Left Arm)   Pulse (!) 104   Temp 98.1 ?F (36.7 ?C) (Oral)   Resp (!) 26   SpO2 96%  ?Gen:   Awake, no distress   ?Resp:  Normal effort  ?MSK:   Moves extremities without difficulty  ?Other:   ? ?Medical Decision Making  ?Medically screening exam initiated at 6:00 PM.  Appropriate orders placed.  Jimmy [name] was informed that the remainder of the evaluation will be completed by another provider, this initial triage assessment does not replace that evaluation, and the importance of remaining in the ED until their evaluation is complete. ? ? ?  ?Dorothyann Peng, PA-C ?11/14/21 1801 ? ?

## 2021-11-14 NOTE — Telephone Encounter (Signed)
Oral Chemotherapy Pharmacist Encounter  ? ?Spoke with patient today to follow up regarding patient's oral chemotherapy medication: Xeloda (capecitabine) ? ?Medication is scheduled to be delivered to patient's home on 11/14/21. He knows not to start this until 11/23/21.  ? ?Patient currently at work, so will call back patient later this week for initial counseling of Xeloda. Patient expressed appreciation and understanding, no other questions at this time.  ? ?Patient knows to call the office with questions or concerns. ? ?Leron Croak, PharmD, BCPS ?Hematology/Oncology Clinical Pharmacist ?[facility] Oral Chemotherapy Navigation Clinics ?[phone] ?11/14/2021 11:25 AM ? ? ?

## 2021-11-14 NOTE — ED Provider Notes (Signed)
?[facility] DEPT ?Provider Note ? ? ?CSN: [id number] ?Arrival date & time: 11/14/21  1749 ? ?  ? ?History ? ?Chief Complaint  ?Patient presents with  ? Chest Pain  ? ? ?Jimmy [name] is a 51 y.o. male here presenting with chest pain.  Patient states that 3 weeks ago, he had colon cancer and had colon resection.  He states that he just went back to work today.  He states that while at work he feels very short of breath.  Also has some substernal chest pain as well.  Patient went to oncology center was noted to be hypertensive.  Patient states that he is not on any blood pressure medicine.  Denies any history of heart disease.  ? ?The history is provided by the patient.  ? ?  ? ?Home Medications ?Prior to Admission medications   ?Medication Sig Start Date End Date Taking? Authorizing Provider  ?amitriptyline (ELAVIL) 50 MG tablet Take 50 mg by mouth at bedtime.    [provider]  ?capecitabine (XELODA) 500 MG tablet Take 4 tablets (2,000 mg total) by mouth 2 (two) times daily after a meal. Take for 14 days, then off for 7 days. Repeat every 21 days. 11/09/21   Truitt Merle, MD  ?ondansetron (ZOFRAN) 8 MG tablet Take 1 tablet (8 mg total) by mouth 2 (two) times daily as needed for refractory nausea / vomiting. Start on day 3 after chemotherapy. 11/08/21   Truitt Merle, MD  ?prochlorperazine (COMPAZINE) 10 MG tablet Take 1 tablet (10 mg total) by mouth every 6 (six) hours as needed (Nausea or vomiting). 11/08/21   Truitt Merle, MD  ?sertraline (ZOLOFT) 100 MG tablet Take 100 mg by mouth every evening.    [provider]  ?testosterone cypionate (DEPOTESTOSTERONE CYPIONATE) 200 MG/ML injection Inject 0.5 mLs into the skin every 14 (fourteen) days. 06/28/21   [provider]  ?traMADol (ULTRAM) 50 MG tablet Take 1-2 tablets (50-100 mg total) by mouth every 6 (six) hours as needed for moderate pain or severe pain. 10/19/21   Deiontae Boston, MD  ?   ? ?Allergies    ?Patient has no  known allergies.   ? ?Review of Systems   ?Review of Systems  ?Cardiovascular:  Positive for chest pain.  ?All other systems reviewed and are negative. ? ?Physical Exam ?Updated Vital Signs ?BP (!) 150/102   Pulse (!) 102   Temp 98.1 ?F (36.7 ?C) (Oral)   Resp 12   SpO2 97%  ?Physical Exam ?Vitals and nursing note reviewed.  ?Constitutional:   ?   Appearance: He is well-developed.  ?HENT:  ?   Head: Normocephalic.  ?Eyes:  ?   Extraocular Movements: Extraocular movements intact.  ?   Pupils: Pupils are equal, round, and reactive to light.  ?Cardiovascular:  ?   Rate and Rhythm: Normal rate and regular rhythm.  ?   Heart sounds: Normal heart sounds.  ?Pulmonary:  ?   Effort: Pulmonary effort is normal.  ?   Breath sounds: Normal breath sounds.  ?Abdominal:  ?   General: Bowel sounds are normal.  ?   Palpations: Abdomen is soft.  ?Musculoskeletal:     ?   General: Normal range of motion.  ?   Cervical back: Normal range of motion and neck supple.  ?Skin: ?   General: Skin is warm.  ?   Capillary Refill: Capillary refill takes less than 2 seconds.  ?Neurological:  ?   General: No  focal deficit present.  ?   Mental Status: He is alert and oriented to person, place, and time.  ?Psychiatric:     ?   Mood and Affect: Mood normal.     ?   Behavior: Behavior normal.  ? ? ?ED Results / Procedures / Treatments   ?Labs ?(all labs ordered are listed, but only abnormal results are displayed) ?Labs Reviewed  ?CBC - Abnormal; Notable for the following components:  ?    Result Value  ? WBC 10.9 (*)   ? All other components within normal limits  ?COMPREHENSIVE METABOLIC PANEL - Abnormal; Notable for the following components:  ? Glucose, Bld 102 (*)   ? All other components within normal limits  ?TROPONIN I (HIGH SENSITIVITY)  ?TROPONIN I (HIGH SENSITIVITY)  ? ? ?EKG ?EKG Interpretation ? ?Date/Time:  Tuesday November 14 2021 17:55:35 EDT ?Ventricular Rate:  101 ?PR Interval:  172 ?QRS Duration: 97 ?QT Interval:  332 ?QTC  Calculation: 431 ?R Axis:   -11 ?Text Interpretation: Sinus tachycardia Consider anterior infarct No previous ECGs available Confirmed by Wandra Arthurs [phone]) on 11/14/2021 8:48:19 PM ? ?Radiology ?DG Chest Port 1 View ? ?Result Date: 11/14/2021 ?CLINICAL DATA:  Chest pain EXAM: PORTABLE CHEST 1 VIEW COMPARISON:  2018 FINDINGS: Right medial basilar opacity. Additional patchy density at the left lung base. No significant pleural effusion. No pneumothorax. Heart size is within normal limits for technique. IMPRESSION: Bibasilar atelectasis/consolidation. Electronically Signed   By: Macy Mis M.D.   On: 11/14/2021 18:27   ? ?Procedures ?Procedures  ? ? ?Medications Ordered in ED ?Medications  ?iohexol (OMNIPAQUE) 350 MG/ML injection 100 mL (80 mLs Intravenous Contrast Given 11/14/21 2108)  ? ? ?ED Course/ Medical Decision Making/ A&P ?  ?                        ?Medical Decision Making ?Jimmy [name] is a 51 y.o. male here present with chest pain.  Patient has some chest pain and subjective shortness of breath.  Had surgery 3 weeks ago.  Consider ACS versus symptomatic hypertension versus PE.  Plan to get CBC and CMP and troponin x2 and CTA chest.  ? ?10:06 PM ?Troponin negative x2.  CT chest showed no PE.  Patient likely has symptomatic hypertension.  We will start Norvasc 5 mg daily.  If he has persistent chest pressure recommend outpatient stress test with cardiology.  ? ?Problems Addressed: ?Other chest pain: acute illness or injury ? ?Amount and/or Complexity of Data Reviewed ?Labs: ordered. Decision-making details documented in ED Course. ?Radiology: ordered and independent interpretation performed. Decision-making details documented in ED Course. ?ECG/medicine tests: ordered and independent interpretation performed. Decision-making details documented in ED Course. ? ? ?Final Clinical Impression(s) / ED Diagnoses ?Final diagnoses:  ?None  ? ? ?Rx / DC Orders ?ED Discharge Orders   ? ? None  ? ?  ? ? ?  ?Drenda Freeze, MD ?11/14/21 2210 ? ?

## 2021-11-16 NOTE — Progress Notes (Signed)
Pharmacist Chemotherapy Monitoring - Initial Assessment   ? ?Anticipated start date: 11/23/21  ? ?The following has been reviewed per standard work regarding the patient's treatment regimen: ?The patient's diagnosis, treatment plan and drug doses, and organ/hematologic function ?Lab orders and baseline tests specific to treatment regimen  ?The treatment plan start date, drug sequencing, and pre-medications ?Prior authorization status  ?Patient's documented medication list, including drug-drug interaction screen and prescriptions for anti-emetics and supportive care specific to the treatment regimen ?The drug concentrations, fluid compatibility, administration routes, and timing of the medications to be used ?The patient's access for treatment and lifetime cumulative dose history, if applicable  ?The patient's medication allergies and previous infusion related reactions, if applicable  ? ?Changes made to treatment plan:  ?N/A ? ?Follow up needed:  ?N/A ? ? ?Raul Del [place], [place], BCPS, BCOP ?11/16/2021  4:10 PM  ?

## 2021-11-16 NOTE — Telephone Encounter (Signed)
Oral Chemotherapy Pharmacist Encounter ? ?I spoke with patient for overview of: Xeloda (capecitabine) for the treatment of stage IIIB colon cancer in conjunction with infusional oxaliplatin, planned duration 3 months of therapy.  ? ?Counseled patient on administration, dosing, side effects, monitoring, drug-food interactions, safe handling, storage, and disposal. ? ?Patient will take Xeloda '500mg'$  tablets, 4 tablets ('2000mg'$ ) by mouth in AM and 4 tabs ('2000mg'$ ) by mouth in PM, within 30 minutes of finishing meals, on days 1-14 of each 21 day cycle.  ? ?Oxaliplatin will be infused on day 1 of each 21 day cycle. ? ?Xeloda and oxaliplatin start date: 11/23/21 ? ?Adverse effects include but are not limited to: fatigue, decreased blood counts, GI upset, diarrhea, mouth sores, and hand-foot syndrome. ? ?Patient has anti-emetic on hand and knows to take it if nausea develops.   ?Patient will obtain anti diarrheal and alert the office of 4 or more loose stools above baseline. ? ?Reviewed with patient importance of keeping a medication schedule and plan for any missed doses. No barriers to medication adherence identified. ? ?Medication reconciliation performed and medication/allergy list updated. ? ?Insurance authorization for Xeloda has been obtained. Medication has already been delivered to patient's home from [place] ? ?All questions answered. ? ?Jimmy [name] voiced understanding and appreciation.  ? ?Medication education handout and medication calendar placed in mail for patient. Patient knows to call the office with questions or concerns. Oral Chemotherapy Clinic phone number provided to patient.  ? ?Leron Croak, PharmD, BCPS ?Hematology/Oncology Clinical Pharmacist ?[facility] Oral Chemotherapy Navigation Clin[facility] ?[phone] ?11/16/2021 3:44 PM ?

## 2021-11-17 ENCOUNTER — Ambulatory Visit (HOSPITAL_COMMUNITY): Admission: RE | Admit: 2021-11-17 | Payer: 59 | Source: Ambulatory Visit

## 2021-11-21 NOTE — Progress Notes (Signed)
[facility] ?OFFICE PROGRESS NOTE ? ?Enid Skeens., MD ?[address] ?[place] [address] ? ?DIAGNOSIS: Colon Cancer ? ?Oncology History  ?Cancer of sigmoid colon ([place])  ?08/14/2021 Procedure  ? Colonoscopy, Dr. Michail Sermon ? ?Impression: ? - Malignant partially obstructing tumor in the sigmoid colon. Biopsied. Tattooed. ?  ?08/14/2021 Initial Biopsy  ? INVASIVE MODERATELY DIFFERENTIATED ADENOCARCINOMA ?  ?09/20/2021 Imaging  ? EXAM: ?CT CHEST, ABDOMEN, AND PELVIS WITH CONTRAST ? ?IMPRESSION: ?1. Short segment focal wall thickening of the sigmoid colon, ?possibly reflecting patient's newly diagnosed colon cancer. ?2. Prominent lymph nodes in the sigmoid mesentery, adjacent to the wall thickening, the largest of which measures 5 mm in short axis are nonspecific but suspicious for early nodal involvement. ?3. Two tiny hypodense hepatic lesions measuring up to 6 mm, the ?larger of which was present on prior study dating back to January ?27, 2021, but the 4 mm is new since then, and both of which are too small to accurately characterize. While these almost certainly ?reflect a benign etiology, given that the 4 mm lesion in the right ?lobe of liver is new from prior, a hepatic metastases in this ?patient with colon cancer can not be completely excluded. Suggest ?attention on follow-up imaging. ?4. Otherwise, no definite evidence of distant metastatic disease in ?the chest, abdomen or pelvis. ?5. Diffuse hepatic steatosis. ?6. Punctate nonobstructive left lower pole renal calculus. ?7. Colonic diverticulosis without findings of acute diverticulitis. ?  ?10/19/2021 Initial Diagnosis  ? Cancer of sigmoid ([place]) ? ?  ?10/19/2021 Cancer Staging  ? Staging form: Colon and Rectum, AJCC 8th Edition ?- Pathologic stage from 10/19/2021: Stage IIIB (pT3, pN1b, cM0) - Signed by Truitt Merle, MD on 11/06/2021 ?Histologic grading system: 4 grade system ?Histologic grade (G): G2 ?Residual tumor (R): R0 - None ? ?  ?10/19/2021  Definitive Surgery  ? FINAL MICROSCOPIC DIAGNOSIS:  ? ?A.   LEFT COLON, RESECTION:  ?-    Adenocarcinoma, grade 2 (moderately differentiated), 4 cm in  ?greatest dimension, invasive through muscularis propria into pericolonic tissue.  ?-    All margins negative for tumor.  ?-    Metastatic carcinoma present in two of eighteen regional lymph  ?nodes (2/18).  ? ?B.   FINAL DISTAL MARGIN, EXCISION:  ?-    Negative for adenomatous change and malignancy.  ?  ?11/23/2021 -  Chemotherapy  ? Patient is on Treatment Plan : COLORECTAL Xelox (Capeox) q21d  ? ?  ?  ? ? ?CURRENT THERAPY: CAPOX first dose expected today 11/23/2021 ? ?INTERVAL HISTORY: ?Jimmy [name] 51 y.o. male returns to clinic today for a follow-up visit.  The patient was recently diagnosed with colorectal cancer.  He underwent hemicolectomy and he is here today to start his first cycle of adjuvant chemotherapy.  He is on CAPOX.  He started his first day of oral Xeloda this morning.  He is here for IV oxaliplatin today.  He does not have any questions.  Since last being seen, the patient went to the emergency room on 11/14/2021 for substernal chest discomfort and mild shortness of breath.  The patient troponins were negative x2 and his CT angiogram was negative for any PE.  He had hypertension and it was thought that he had symptomatic hypertension.  He was started on Norvasc 5 mg.  The ER recommended outpatient stress test with cardiology if his symptoms persisted. The patient denies any recurrent symptoms. He is taking norvasc as prescribed.  ? ?He is  scheduled for a liver MRI tomorrow on 11/24/21 to follow up on the 2 small liver lesions. This was supposed to be performed on 11/14/21; however, the patient had eaten a pastry that morning and this scan had to be rescheduled.  ? ?Otherwise the patient denies any new concerning complaints today.  Denies any fever, chills, night sweats, or appetite change. He lost about 5 lbs but this was reportedly due to trying  to drink more water and eat healthier.  Denies any abdominal pain denies any bloating, dysphagia, or odynophagia.  Denies any rectal bleeding.  Denies any nausea, vomiting, diarrhea, or constipation.  Denies any shortness of breath, cough, or chest pain.  He is here today for evaluation and repeat blood work before undergoing cycle #1. ? ?MEDICAL HISTORY: ?Past Medical History:  ?Diagnosis Date  ? Anxiety   ? Cancer [facility])   ? GERD (gastroesophageal reflux disease)   ? History of kidney stones   ? Sleep apnea   ? ? ?ALLERGIES:  has No Known Allergies. ? ?MEDICATIONS:  ?Current Outpatient Medications  ?Medication Sig Dispense Refill  ? amitriptyline (ELAVIL) 50 MG tablet Take 50 mg by mouth at bedtime.    ? amLODipine (NORVASC) 5 MG tablet Take 1 tablet (5 mg total) by mouth daily. 30 tablet 0  ? capecitabine (XELODA) 500 MG tablet Take 4 tablets (2,000 mg total) by mouth 2 (two) times daily after a meal. Take for 14 days, then off for 7 days. Repeat every 21 days. 112 tablet 1  ? sertraline (ZOLOFT) 100 MG tablet Take 100 mg by mouth every evening.    ? testosterone cypionate (DEPOTESTOSTERONE CYPIONATE) 200 MG/ML injection Inject 0.5 mLs into the skin every 14 (fourteen) days.    ? traMADol (ULTRAM) 50 MG tablet Take 1-2 tablets (50-100 mg total) by mouth every 6 (six) hours as needed for moderate pain or severe pain. 20 tablet 0  ? ondansetron (ZOFRAN) 8 MG tablet Take 1 tablet (8 mg total) by mouth 2 (two) times daily as needed for refractory nausea / vomiting. Start on day 3 after chemotherapy. (Patient not taking: Reported on 11/23/2021) 30 tablet 1  ? prochlorperazine (COMPAZINE) 10 MG tablet Take 1 tablet (10 mg total) by mouth every 6 (six) hours as needed (Nausea or vomiting). (Patient not taking: Reported on 11/23/2021) 30 tablet 1  ? ?No current facility-administered medications for this visit.  ? ? ?SURGICAL HISTORY:  ?Past Surgical History:  ?Procedure Laterality Date  ? EXTRACORPOREAL SHOCK WAVE  LITHOTRIPSY Right 09/17/2019  ? Procedure: EXTRACORPOREAL SHOCK WAVE LITHOTRIPSY (ESWL);  Surgeon: Kathie Rhodes, MD;  [facility];  Service: Urology;  Laterality: Right;  ? PROCTOSCOPY N/A 10/19/2021  ? Procedure: RIGIDI PROCTOSCOPY;  Surgeon: Honor Boston, MD;  Location: [facility];  Service: General;  Laterality: N/A;  ? WISDOM TOOTH EXTRACTION    ? ? ?REVIEW OF SYSTEMS:   ?Review of Systems  ?Constitutional: Negative for appetite change, chills, fatigue, fever and unexpected weight change.  ?HENT:   Negative for mouth sores, nosebleeds, sore throat and trouble swallowing.   ?Eyes: Negative for eye problems and icterus.  ?Respiratory: Negative for cough, hemoptysis, shortness of breath and wheezing.   ?Cardiovascular: Negative for chest pain and leg swelling.  ?Gastrointestinal: Negative for abdominal pain, constipation, diarrhea, nausea and vomiting.  ?Genitourinary: Negative for bladder incontinence, difficulty urinating, dysuria, frequency and hematuria.   ?Musculoskeletal: Negative for back pain, gait problem, neck pain and neck stiffness.  ?Skin: Negative for itching  and rash.  ?Neurological: Negative for dizziness, extremity weakness, gait problem, headaches, light-headedness and seizures.  ?Hematological: Negative for adenopathy. Does not bruise/bleed easily.  ?Psychiatric/Behavioral: Negative for confusion, depression and sleep disturbance. The patient is not nervous/anxious.   ? ? ?PHYSICAL EXAMINATION:  ?Blood pressure 114/82, pulse 88, temperature 97.6 ?F (36.4 ?C), temperature source Tympanic, resp. rate 17, weight 224 lb 6 oz (101.8 kg), SpO2 97 %. ? ?ECOG PERFORMANCE STATUS: 1 ? ?Physical Exam  ?Constitutional: Oriented to person,  ?HENT:  ?Head: Normocephalic and atraumatic.  ?Mouth/Throat: Oropharynx is clear and moist. No oropharyngeal exudate.  ?Eyes: Conjunctivae are normal. Right eye exhibits no discharge. Left eye exhibits no discharge. No scleral icterus.  ?Neck: Normal  range of motion. Neck supple.  ?Cardiovascular: Normal rate, regular rhythm, normal heart sounds and intact distal pulses.   ?Pulmonary/Chest: Effort normal and breath sounds normal. No respiratory distress. No whee

## 2021-11-23 ENCOUNTER — Inpatient Hospital Stay: Payer: 59

## 2021-11-23 ENCOUNTER — Encounter: Payer: Self-pay | Admitting: Physician Assistant

## 2021-11-23 ENCOUNTER — Other Ambulatory Visit: Payer: Self-pay

## 2021-11-23 ENCOUNTER — Inpatient Hospital Stay: Payer: 59 | Admitting: Physician Assistant

## 2021-11-23 VITALS — BP 114/82 | HR 88 | Temp 97.6°F | Resp 17 | Wt 224.4 lb

## 2021-11-23 DIAGNOSIS — C187 Malignant neoplasm of sigmoid colon: Secondary | ICD-10-CM

## 2021-11-23 DIAGNOSIS — Z5111 Encounter for antineoplastic chemotherapy: Secondary | ICD-10-CM | POA: Diagnosis not present

## 2021-11-23 LAB — COMPREHENSIVE METABOLIC PANEL
ALT: 34 U/L (ref 0–44)
AST: 22 U/L (ref 15–41)
Albumin: 4.1 g/dL (ref 3.5–5.0)
Alkaline Phosphatase: 70 U/L (ref 38–126)
Anion gap: 9 (ref 5–15)
BUN: 17 mg/dL (ref 6–20)
CO2: 25 mmol/L (ref 22–32)
Calcium: 9.1 mg/dL (ref 8.9–10.3)
Chloride: 105 mmol/L (ref 98–111)
Creatinine, Ser: 0.9 mg/dL (ref 0.61–1.24)
GFR, Estimated: >60 mL/min (ref >60)
Glucose, Bld: 118 mg/dL — ABNORMAL HIGH (ref 70–99)
Potassium: 3.5 mmol/L (ref 3.5–5.1)
Sodium: 139 mmol/L (ref 135–145)
Total Bilirubin: 0.6 mg/dL (ref 0.3–1.2)
Total Protein: 7.3 g/dL (ref 6.5–8.1)

## 2021-11-23 LAB — CBC WITH DIFFERENTIAL/PLATELET
Abs Immature Granulocytes: 0.02 10*3/uL (ref 0.00–0.07)
Basophils Absolute: 0 10*3/uL (ref 0.0–0.1)
Basophils Relative: 1 %
Eosinophils Absolute: 0.2 10*3/uL (ref 0.0–0.5)
Eosinophils Relative: 4 %
HCT: 42.6 % (ref 39.0–52.0)
Hemoglobin: 14.3 g/dL (ref 13.0–17.0)
Immature Granulocytes: 1 %
Lymphocytes Relative: 19 %
Lymphs Abs: 0.8 10*3/uL (ref 0.7–4.0)
MCH: 30.4 pg (ref 26.0–34.0)
MCHC: 33.6 g/dL (ref 30.0–36.0)
MCV: 90.4 fL (ref 80.0–100.0)
Monocytes Absolute: 0.5 10*3/uL (ref 0.1–1.0)
Monocytes Relative: 13 %
Neutro Abs: 2.6 10*3/uL (ref 1.7–7.7)
Neutrophils Relative %: 62 %
Platelets: 283 10*3/uL (ref 150–400)
RBC: 4.71 MIL/uL (ref 4.22–5.81)
RDW: 12.7 % (ref 11.5–15.5)
WBC: 4.2 10*3/uL (ref 4.0–10.5)
nRBC: 0 % (ref 0.0–0.2)

## 2021-11-23 MED ORDER — SODIUM CHLORIDE 0.9 % IV SOLN
10.0000 mg | Freq: Once | INTRAVENOUS | Status: AC
  Administered 2021-11-23: 10 mg via INTRAVENOUS
  Filled 2021-11-23: qty 10

## 2021-11-23 MED ORDER — PALONOSETRON HCL INJECTION 0.25 MG/5ML
0.2500 mg | Freq: Once | INTRAVENOUS | Status: AC
  Administered 2021-11-23: 0.25 mg via INTRAVENOUS
  Filled 2021-11-23: qty 5

## 2021-11-23 MED ORDER — DEXTROSE 5 % IV SOLN: Freq: Once | INTRAVENOUS | Status: AC

## 2021-11-23 MED ORDER — OXALIPLATIN CHEMO INJECTION 100 MG/20ML
129.0000 mg/m2 | Freq: Once | INTRAVENOUS | Status: AC
  Administered 2021-11-23: 300 mg via INTRAVENOUS
  Filled 2021-11-23: qty 60

## 2021-11-23 NOTE — Patient Instructions (Signed)
[facility]  Discharge Instructions: ?Thank you for choosing [facility] to provide your oncology and hematology care.  ? ?If you have a lab appointment with the [facility], please go directly to the [facility] and check in at the registration area. ?  ?Wear comfortable clothing and clothing appropriate for easy access to any Portacath or PICC line.  ? ?We strive to give you quality time with your provider. You may need to reschedule your appointment if you arrive late (15 or more minutes).  Arriving late affects you and other patients whose appointments are after yours.  Also, if you miss three or more appointments without notifying the office, you may be dismissed from the clinic at the provider?s discretion.    ?  ?For prescription refill requests, have your pharmacy contact our office and allow 72 hours for refills to be completed.   ? ?Today you received the following chemotherapy and/or immunotherapy agents: Oxaliplatin.     ?  ?To help prevent nausea and vomiting after your treatment, we encourage you to take your nausea medication as directed. ? ?BELOW ARE SYMPTOMS THAT SHOULD BE REPORTED IMMEDIATELY: ?*FEVER GREATER THAN 100.4 F (38 ?C) OR HIGHER ?*CHILLS OR SWEATING ?*NAUSEA AND VOMITING THAT IS NOT CONTROLLED WITH YOUR NAUSEA MEDICATION ?*UNUSUAL SHORTNESS OF BREATH ?*UNUSUAL BRUISING OR BLEEDING ?*URINARY PROBLEMS (pain or burning when urinating, or frequent urination) ?*BOWEL PROBLEMS (unusual diarrhea, constipation, pain near the anus) ?TENDERNESS IN MOUTH AND THROAT WITH OR WITHOUT PRESENCE OF ULCERS (sore throat, sores in mouth, or a toothache) ?UNUSUAL RASH, SWELLING OR PAIN  ?UNUSUAL VAGINAL DISCHARGE OR ITCHING  ? ?Items with * indicate a potential emergency and should be followed up as soon as possible or go to the Emergency Department if any problems should occur. ? ?Please show the CHEMOTHERAPY ALERT CARD or IMMUNOTHERAPY ALERT CARD at check-in  to the Emergency Department and triage nurse. ? ?Should you have questions after your visit or need to cancel or reschedule your appointment, please contact [facility]  Dept: [phone]  and follow the prompts.  Office hours are 8:00 a.m. to 4:30 p.m. Monday - Friday. Please note that voicemails left after 4:00 p.m. may not be returned until the following business day.  We are closed weekends and major holidays. You have access to a nurse at all times for urgent questions. Please call the main number to the clinic Dept: [phone] and follow the prompts. ? ? ?For any non-urgent questions, you may also contact your provider using MyChart. We now offer e-Visits for anyone 1 and older to request care online for non-urgent symptoms. For details visit [email or web address]. ?  ?Also download the MyChart app! Go to the app store, search "MyChart", open the app, select [facility], and log in with your MyChart username and password. ? ?Due to Covid, a mask is required upon entering the hospital/clinic. If you do not have a mask, one will be given to you upon arrival. For doctor visits, patients may have 1 support person aged 12 or older with them. For treatment visits, patients cannot have anyone with them due to current Covid guidelines and our immunocompromised population.  ? ?Oxaliplatin Injection ?What is this medication? ?OXALIPLATIN (ox AL i PLA tin) is a chemotherapy drug. It targets fast dividing cells, like cancer cells, and causes these cells to die. This medicine is used to treat cancers of the colon and rectum, and many other cancers. ?This medicine  may be used for other purposes; ask your health care provider or pharmacist if you have questions. ?COMMON BRAND NAME(S): Eloxatin ?What should I tell my care team before I take this medication? ?They need to know if you have any of these conditions: ?heart disease ?history of irregular heartbeat ?liver disease ?low blood  counts, like white cells, platelets, or red blood cells ?lung or breathing disease, like asthma ?take medicines that treat or prevent blood clots ?tingling of the fingers or toes, or other nerve disorder ?an unusual or allergic reaction to oxaliplatin, other chemotherapy, other medicines, foods, dyes, or preservatives ?pregnant or trying to get pregnant ?breast-feeding ?How should I use this medication? ?This drug is given as an infusion into a vein. It is administered in a hospital or clinic by a specially trained health care professional. ?Talk to your pediatrician regarding the use of this medicine in children. Special care may be needed. ?Overdosage: If you think you have taken too much of this medicine contact a poison control center or emergency room at once. ?NOTE: This medicine is only for you. Do not share this medicine with others. ?What if I miss a dose? ?It is important not to miss a dose. Call your doctor or health care professional if you are unable to keep an appointment. ?What may interact with this medication? ?Do not take this medicine with any of the following medications: ?cisapride ?dronedarone ?pimozide ?thioridazine ?This medicine may also interact with the following medications: ?aspirin and aspirin-like medicines ?certain medicines that treat or prevent blood clots like warfarin, apixaban, dabigatran, and rivaroxaban ?cisplatin ?cyclosporine ?diuretics ?medicines for infection like acyclovir, adefovir, amphotericin B, bacitracin, cidofovir, foscarnet, ganciclovir, gentamicin, pentamidine, vancomycin ?NSAIDs, medicines for pain and inflammation, like ibuprofen or naproxen ?other medicines that prolong the QT interval (an abnormal heart rhythm) ?pamidronate ?zoledronic acid ?This list may not describe all possible interactions. Give your health care provider a list of all the medicines, herbs, non-prescription drugs, or dietary supplements you use. Also tell them if you smoke, drink alcohol,  or use illegal drugs. Some items may interact with your medicine. ?What should I watch for while using this medication? ?Your condition will be monitored carefully while you are receiving this medicine. ?You may need blood work done while you are taking this medicine. ?This medicine may make you feel generally unwell. This is not uncommon as chemotherapy can affect healthy cells as well as cancer cells. Report any side effects. Continue your course of treatment even though you feel ill unless your healthcare professional tells you to stop. ?This medicine can make you more sensitive to cold. Do not drink cold drinks or use ice. Cover exposed skin before coming in contact with cold temperatures or cold objects. When out in cold weather wear warm clothing and cover your mouth and nose to warm the air that goes into your lungs. Tell your doctor if you get sensitive to the cold. ?Do not become pregnant while taking this medicine or for 9 months after stopping it. Women should inform their health care professional if they wish to become pregnant or think they might be pregnant. Men should not father a child while taking this medicine and for 6 months after stopping it. There is potential for serious side effects to an unborn child. Talk to your health care professional for more information. ?Do not breast-feed a child while taking this medicine or for 3 months after stopping it. ?This medicine has caused ovarian failure in some women. This medicine may  make it more difficult to get pregnant. Talk to your health care professional if you are concerned about your fertility. ?This medicine has caused decreased sperm counts in some men. This may make it more difficult to father a child. Talk to your health care professional if you are concerned about your fertility. ?This medicine may increase your risk of getting an infection. Call your health care professional for advice if you get a fever, chills, or sore throat, or other  symptoms of a cold or flu. Do not treat yourself. Try to avoid being around people who are sick. ?Avoid taking medicines that contain aspirin, acetaminophen, ibuprofen, naproxen, or ketoprofen unless instruc

## 2021-11-24 ENCOUNTER — Ambulatory Visit (HOSPITAL_COMMUNITY)
Admission: RE | Admit: 2021-11-24 | Discharge: 2021-11-24 | Disposition: A | Discharge status: Home / Self Care | Payer: 59 | Source: Ambulatory Visit | Attending: Hematology | Admitting: Hematology

## 2021-11-24 ENCOUNTER — Telehealth: Payer: Self-pay

## 2021-11-24 ENCOUNTER — Other Ambulatory Visit: Payer: Self-pay | Admitting: Hematology

## 2021-11-24 DIAGNOSIS — C187 Malignant neoplasm of sigmoid colon: Secondary | ICD-10-CM | POA: Diagnosis present

## 2021-11-24 MED ORDER — GADOBUTROL 1 MMOL/ML IV SOLN
10.0000 mL | Freq: Once | INTRAVENOUS | Status: AC | PRN
  Administered 2021-11-24: 10 mL via INTRAVENOUS

## 2021-11-24 NOTE — Telephone Encounter (Signed)
-----   Message from Rafael Bihari, RN sent at 11/23/2021  1:38 PM EDT ----- ?Regarding: Dr Burr Medico pt. First time Oxaliplatin ?Dr Burr Medico pt came in 4/20 for first time Oxaliplatin infusion. Tolerated infusion well. No complaints. Needs call back.  ? ?

## 2021-11-24 NOTE — Telephone Encounter (Signed)
Mr. Eudy states that he is doing fine. He has experienced some jaw and throat spasms if the fluid has not been warm enough to drink.  He used his hands to cup his mouth to reduce the cold sensation. ?He is eating,drinking and urinating well. ?He knows to call the office at [phone] if he has any questions or concerns.  ?

## 2021-12-01 ENCOUNTER — Encounter: Payer: Self-pay | Admitting: Hematology

## 2021-12-01 ENCOUNTER — Inpatient Hospital Stay (HOSPITAL_BASED_OUTPATIENT_CLINIC_OR_DEPARTMENT_OTHER): Payer: 59 | Admitting: Hematology

## 2021-12-01 DIAGNOSIS — C187 Malignant neoplasm of sigmoid colon: Secondary | ICD-10-CM

## 2021-12-01 NOTE — Progress Notes (Signed)
?[facility]   ?[phone] [phone]   ?Clinic Follow up Note  ? ?Patient Care Team: ?Enid Skeens., MD as PCP - General (Family Medicine) ?Zimir Boston, MD as Consulting Physician (General Surgery) ?Wilford Corner, MD as Consulting Physician (Gastroenterology) ?Revankar, Reita Cliche, MD as Consulting Physician (Cardiology) ?Truitt Merle, MD as Consulting Physician (Oncology) ?Earl Gala, Deliah Goody, RN as Sales executive (Oncology) ? ?Date of Service:  12/01/2021 ? ?I connected with Jimmy [name] on 12/01/2021 at  9:20 AM EDT by telephone visit and verified that I am speaking with the correct person using two identifiers.  ?I discussed the limitations, risks, security and privacy concerns of performing an evaluation and management service by telephone and the availability of in person appointments. I also discussed with the patient that there may be a patient responsible charge related to this service. The patient expressed understanding and agreed to proceed.  ? ?Other persons participating in the visit and their role in the encounter:  none ? ?Patient's location:  home ?Provider's location:  my office ? ?CHIEF COMPLAINT: f/u of colon cancer ? ?CURRENT THERAPY:  ?Adjuvant CAPOX, q21d, started 11/23/21 ? -Xeloda dose: 2000 mg BID, days 1-14 ? ?ASSESSMENT & PLAN:  ?Jimmy [name] is a 51 y.o. male with  ? ?1. Cancer of Sigmoid Colon, moderately differentiated adenocarcinoma, stage IIIB p(T3, N1b), MMR normal  ?-presented with intermittent rectal bleeding for 6 months, first colonoscopy on 08/14/21 by Dr. Michail Sermon showed sigmoid colon mass, biopsy confirmed adenocarcinoma. ?-baseline CEA on 09/12/21 was WNL. ?-CT CAP on 09/20/21 showed: wall thickening of sigmoid colon; nonspecific but suspicious sigmoid mesentery lymph nodes; two indeterminate tiny hepatic lesions; otherwise no definite metastatic disease.  ?-left hemicolectomy on 10/19/21 by Dr. Johney Maine showed 4 cm adenocarcinoma,  grade 2 (moderately differentiated), invasive through muscularis propria into pericolonic tissue; margins negative; metastatic carcinoma in two lymph nodes (2/18).  No lymphovascular or perineural invasion. ?-liver MRI on 11/24/21 showed benign cysts, no evidence of liver or other metastatic disease. I reviewed the results with him today. ?-given his high risk of recurrence, he was started on adjuvant CAPOX on 11/23/21. He tolerated very well with only some expected cold sensitivity. He will continue Xeloda for another week. ?  ?  ?PLAN:  ?-continue cycle 1 Xeloda for one more week ?-lab, f/u, and next cycle CAPOX in 2 weeks ? ? ?No problem-specific Assessment & Plan notes found for this encounter. ? ? ?SUMMARY OF ONCOLOGIC HISTORY: ?Oncology History  ?Cancer of sigmoid colon ([facility])  ?08/14/2021 Procedure  ? Colonoscopy, Dr. Michail Sermon ? ?Impression: ? - Malignant partially obstructing tumor in the sigmoid colon. Biopsied. Tattooed. ?  ?08/14/2021 Initial Biopsy  ? INVASIVE MODERATELY DIFFERENTIATED ADENOCARCINOMA ?  ?09/20/2021 Imaging  ? EXAM: ?CT CHEST, ABDOMEN, AND PELVIS WITH CONTRAST ? ?IMPRESSION: ?1. Short segment focal wall thickening of the sigmoid colon, ?possibly reflecting patient's newly diagnosed colon cancer. ?2. Prominent lymph nodes in the sigmoid mesentery, adjacent to the wall thickening, the largest of which measures 5 mm in short axis are nonspecific but suspicious for early nodal involvement. ?3. Two tiny hypodense hepatic lesions measuring up to 6 mm, the ?larger of which was present on prior study dating back to January ?27, 2021, but the 4 mm is new since then, and both of which are too small to accurately characterize. While these almost certainly ?reflect a benign etiology, given that the 4 mm lesion in the right ?lobe of liver is new from prior,  a hepatic metastases in this ?patient with colon cancer can not be completely excluded. Suggest ?attention on follow-up imaging. ?4. Otherwise, no  definite evidence of distant metastatic disease in ?the chest, abdomen or pelvis. ?5. Diffuse hepatic steatosis. ?6. Punctate nonobstructive left lower pole renal calculus. ?7. Colonic diverticulosis without findings of acute diverticulitis. ?  ?10/19/2021 Initial Diagnosis  ? Cancer of sigmoid ([facility]) ? ?  ?10/19/2021 Cancer Staging  ? Staging form: Colon and Rectum, AJCC 8th Edition ?- Pathologic stage from 10/19/2021: Stage IIIB (pT3, pN1b, cM0) - Signed by Truitt Merle, MD on 11/06/2021 ?Histologic grading system: 4 grade system ?Histologic grade (G): G2 ?Residual tumor (R): R0 - None ? ?  ?10/19/2021 Definitive Surgery  ? FINAL MICROSCOPIC DIAGNOSIS:  ? ?A.   LEFT COLON, RESECTION:  ?-    Adenocarcinoma, grade 2 (moderately differentiated), 4 cm in  ?greatest dimension, invasive through muscularis propria into pericolonic tissue.  ?-    All margins negative for tumor.  ?-    Metastatic carcinoma present in two of eighteen regional lymph  ?nodes (2/18).  ? ?B.   FINAL DISTAL MARGIN, EXCISION:  ?-    Negative for adenomatous change and malignancy.  ?  ?11/23/2021 -  Chemotherapy  ? Patient is on Treatment Plan : COLORECTAL Xelox (Capeox) q21d  ? ?  ?  ? ? ? ?INTERVAL HISTORY:  ?Jimmy [name] was contacted for a follow up of colon cancer. He was last seen by PA Cassie on 11/23/21. ?He reports he tolerated first CAPOX well overall. He reports some cold sensitivity, otherwise no side effects.  ?  ?All other systems were reviewed with the patient and are negative. ? ?MEDICAL HISTORY:  ?Past Medical History:  ?Diagnosis Date  ? Anxiety   ? Cancer [facility])   ? GERD (gastroesophageal reflux disease)   ? History of kidney stones   ? Sleep apnea   ? ? ?SURGICAL HISTORY: ?Past Surgical History:  ?Procedure Laterality Date  ? EXTRACORPOREAL SHOCK WAVE LITHOTRIPSY Right 09/17/2019  ? Procedure: EXTRACORPOREAL SHOCK WAVE LITHOTRIPSY (ESWL);  Surgeon: Kathie Rhodes, MD;  Location: [facility];  Service: Urology;  Laterality:  Right;  ? PROCTOSCOPY N/A 10/19/2021  ? Procedure: RIGIDI PROCTOSCOPY;  Surgeon: Neeko Boston, MD;  Location: [facility];  Service: General;  Laterality: N/A;  ? WISDOM TOOTH EXTRACTION    ? ? ?I have reviewed the social history and family history with the patient and they are unchanged from previous note. ? ?ALLERGIES:  has No Known Allergies. ? ?MEDICATIONS:  ?Current Outpatient Medications  ?Medication Sig Dispense Refill  ? amitriptyline (ELAVIL) 50 MG tablet Take 50 mg by mouth at bedtime.    ? amLODipine (NORVASC) 5 MG tablet Take 1 tablet (5 mg total) by mouth daily. 30 tablet 0  ? capecitabine (XELODA) 500 MG tablet Take 4 tablets (2,000 mg total) by mouth 2 (two) times daily after a meal. Take for 14 days, then off for 7 days. Repeat every 21 days. 112 tablet 1  ? ondansetron (ZOFRAN) 8 MG tablet Take 1 tablet (8 mg total) by mouth 2 (two) times daily as needed for refractory nausea / vomiting. Start on day 3 after chemotherapy. (Patient not taking: Reported on 11/23/2021) 30 tablet 1  ? prochlorperazine (COMPAZINE) 10 MG tablet Take 1 tablet (10 mg total) by mouth every 6 (six) hours as needed (Nausea or vomiting). (Patient not taking: Reported on 11/23/2021) 30 tablet 1  ? sertraline (ZOLOFT) 100 MG tablet Take 100  mg by mouth every evening.    ? testosterone cypionate (DEPOTESTOSTERONE CYPIONATE) 200 MG/ML injection Inject 0.5 mLs into the skin every 14 (fourteen) days.    ? traMADol (ULTRAM) 50 MG tablet Take 1-2 tablets (50-100 mg total) by mouth every 6 (six) hours as needed for moderate pain or severe pain. 20 tablet 0  ? ?No current facility-administered medications for this visit.  ? ? ?PHYSICAL EXAMINATION: ?ECOG PERFORMANCE STATUS: 1 - Symptomatic but completely ambulatory ? ?There were no vitals filed for this visit. ?Wt Readings from Last 3 Encounters:  ?11/23/21 224 lb 6 oz (101.8 kg)  ?11/08/21 229 lb 9.6 oz (104.1 kg)  ?10/19/21 234 lb 9.1 oz (106.4 kg)  ?  ? ?No vitals taken today, Exam not  performed today ? ?LABORATORY DATA:  ?I have reviewed the data as listed ? ?  Latest Ref Rng & Units 11/23/2021  ?  8:53 AM 11/14/2021  ?  6:00 PM 11/08/2021  ?  3:57 PM  ?CBC  ?WBC 4.0 - 10.5 K/uL 4.2   10.9   5.0

## 2021-12-13 ENCOUNTER — Inpatient Hospital Stay: Payer: 59 | Attending: Hematology | Admitting: Hematology

## 2021-12-13 ENCOUNTER — Inpatient Hospital Stay: Payer: 59

## 2021-12-13 ENCOUNTER — Encounter: Payer: Self-pay | Admitting: Hematology

## 2021-12-13 ENCOUNTER — Other Ambulatory Visit: Payer: Self-pay

## 2021-12-13 VITALS — BP 118/80 | HR 85 | Temp 98.1°F | Resp 17 | Ht 74.0 in | Wt 230.8 lb

## 2021-12-13 DIAGNOSIS — Z79899 Other long term (current) drug therapy: Secondary | ICD-10-CM | POA: Diagnosis not present

## 2021-12-13 DIAGNOSIS — Z5111 Encounter for antineoplastic chemotherapy: Secondary | ICD-10-CM | POA: Diagnosis present

## 2021-12-13 DIAGNOSIS — C187 Malignant neoplasm of sigmoid colon: Secondary | ICD-10-CM

## 2021-12-13 DIAGNOSIS — C787 Secondary malignant neoplasm of liver and intrahepatic bile duct: Secondary | ICD-10-CM | POA: Insufficient documentation

## 2021-12-13 DIAGNOSIS — C779 Secondary and unspecified malignant neoplasm of lymph node, unspecified: Secondary | ICD-10-CM | POA: Insufficient documentation

## 2021-12-13 LAB — CBC WITH DIFFERENTIAL/PLATELET
Abs Immature Granulocytes: 0.01 10*3/uL (ref 0.00–0.07)
Basophils Absolute: 0 10*3/uL (ref 0.0–0.1)
Basophils Relative: 1 %
Eosinophils Absolute: 0.1 10*3/uL (ref 0.0–0.5)
Eosinophils Relative: 4 %
HCT: 41.3 % (ref 39.0–52.0)
Hemoglobin: 13.6 g/dL (ref 13.0–17.0)
Immature Granulocytes: 0 %
Lymphocytes Relative: 22 %
Lymphs Abs: 0.7 10*3/uL (ref 0.7–4.0)
MCH: 30.6 pg (ref 26.0–34.0)
MCHC: 32.9 g/dL (ref 30.0–36.0)
MCV: 93 fL (ref 80.0–100.0)
Monocytes Absolute: 0.6 10*3/uL (ref 0.1–1.0)
Monocytes Relative: 18 %
Neutro Abs: 1.8 10*3/uL (ref 1.7–7.7)
Neutrophils Relative %: 55 %
Platelets: 212 10*3/uL (ref 150–400)
RBC: 4.44 MIL/uL (ref 4.22–5.81)
RDW: 14.8 % (ref 11.5–15.5)
WBC: 3.3 10*3/uL — ABNORMAL LOW (ref 4.0–10.5)
nRBC: 0 % (ref 0.0–0.2)

## 2021-12-13 LAB — COMPREHENSIVE METABOLIC PANEL
ALT: 89 U/L — ABNORMAL HIGH (ref 0–44)
AST: 60 U/L — ABNORMAL HIGH (ref 15–41)
Albumin: 4.1 g/dL (ref 3.5–5.0)
Alkaline Phosphatase: 77 U/L (ref 38–126)
Anion gap: 7 (ref 5–15)
BUN: 13 mg/dL (ref 6–20)
CO2: 25 mmol/L (ref 22–32)
Calcium: 9.2 mg/dL (ref 8.9–10.3)
Chloride: 108 mmol/L (ref 98–111)
Creatinine, Ser: 0.88 mg/dL (ref 0.61–1.24)
GFR, Estimated: >60 mL/min (ref >60)
Glucose, Bld: 170 mg/dL — ABNORMAL HIGH (ref 70–99)
Potassium: 3.5 mmol/L (ref 3.5–5.1)
Sodium: 140 mmol/L (ref 135–145)
Total Bilirubin: 0.4 mg/dL (ref 0.3–1.2)
Total Protein: 7.4 g/dL (ref 6.5–8.1)

## 2021-12-13 MED ORDER — CAPECITABINE 500 MG PO TABS: 850.0000 mg/m2 | ORAL_TABLET | Freq: Two times a day (BID) | ORAL | 1 refills | Status: DC

## 2021-12-13 MED ORDER — SODIUM CHLORIDE 0.9 % IV SOLN
10.0000 mg | Freq: Once | INTRAVENOUS | Status: AC
  Administered 2021-12-13: 10 mg via INTRAVENOUS
  Filled 2021-12-13: qty 10

## 2021-12-13 MED ORDER — DEXTROSE 5 % IV SOLN: Freq: Once | INTRAVENOUS | Status: AC

## 2021-12-13 MED ORDER — OXALIPLATIN CHEMO INJECTION 100 MG/20ML
129.0000 mg/m2 | Freq: Once | INTRAVENOUS | Status: AC
  Administered 2021-12-13: 300 mg via INTRAVENOUS
  Filled 2021-12-13: qty 60

## 2021-12-13 MED ORDER — PALONOSETRON HCL INJECTION 0.25 MG/5ML
0.2500 mg | Freq: Once | INTRAVENOUS | Status: AC
  Administered 2021-12-13: 0.25 mg via INTRAVENOUS
  Filled 2021-12-13: qty 5

## 2021-12-13 NOTE — Progress Notes (Signed)
?Jimmy [name]   ?[phone] [phone]   ?Clinic Follow up Note  ? ?Patient Care Team: ?Enid Skeens., MD as PCP - General (Family Medicine) ?Laiken Boston, MD as Consulting Physician (General Surgery) ?Wilford Corner, MD as Consulting Physician (Gastroenterology) ?Revankar, Reita Cliche, MD as Consulting Physician (Cardiology) ?Truitt Merle, MD as Consulting Physician (Oncology) ?Earl Gala, Deliah Goody, RN as Sales executive (Oncology) ? ?Date of Service:  12/13/2021 ? ?CHIEF COMPLAINT: f/u of colon cancer ? ?CURRENT THERAPY:  ?Adjuvant CAPOX, q21d, started 11/23/21 ?            -Xeloda dose: 2000 mg BID, days 1-14 ? ?ASSESSMENT & PLAN:  ?Jimmy [name] is a 51 y.o. male with  ? ?1. Cancer of Sigmoid Colon, moderately differentiated adenocarcinoma, stage IIIB p(T3, N1b), MMR normal  ?-presented with intermittent rectal bleeding for 6 months, first colonoscopy on 08/14/21 by Dr. Michail Sermon showed sigmoid colon mass, biopsy confirmed adenocarcinoma. ?-baseline CEA on 09/12/21 was WNL. ?-CT CAP on 09/20/21 showed: wall thickening of sigmoid colon; nonspecific but suspicious sigmoid mesentery lymph nodes; two indeterminate tiny hepatic lesions; otherwise no definite metastatic disease.  ?-left hemicolectomy on 10/19/21 by Dr. Johney Maine showed 4 cm adenocarcinoma, grade 2 (moderately differentiated), invasive through muscularis propria into pericolonic tissue; margins negative; metastatic carcinoma in two lymph nodes (2/18).  No lymphovascular or perineural invasion. ?-liver MRI on 11/24/21 showed benign cysts, no evidence of liver or other metastatic disease. ?-given his high risk of recurrence, he was started on adjuvant CAPOX on 11/23/21. Plan for 4 cycles. He tolerated first cycle very well with only some expected cold sensitivity. ?-labs reviewed, mild neutropenia and transaminitis, otherwise WNL. Adequate to proceed with cycle 2 today. ?  ?2.  Mild transaminitis ?-New today, likely secondary  to chemotherapy, total bilirubin normal today. ?-He is asymptomatic, will repeat lab next week ?  ?PLAN:  ?-proceed with second oxali today ?-continue Xeloda at same dose, he started this morning  ?-lab, f/u, and CAPOX every 3 weeks as scheduled ?-We will schedule lab appointment in 1 week, to follow-up his LFTs ? ? ?No problem-specific Assessment & Plan notes found for this encounter. ? ? ?SUMMARY OF ONCOLOGIC HISTORY: ?Oncology History  ?Cancer of sigmoid colon ([place])  ?08/14/2021 Procedure  ? Colonoscopy, Dr. Michail Sermon ? ?Impression: ? - Malignant partially obstructing tumor in the sigmoid colon. Biopsied. Tattooed. ?  ?08/14/2021 Initial Biopsy  ? INVASIVE MODERATELY DIFFERENTIATED ADENOCARCINOMA ?  ?09/20/2021 Imaging  ? EXAM: ?CT CHEST, ABDOMEN, AND PELVIS WITH CONTRAST ? ?IMPRESSION: ?1. Short segment focal wall thickening of the sigmoid colon, ?possibly reflecting patient's newly diagnosed colon cancer. ?2. Prominent lymph nodes in the sigmoid mesentery, adjacent to the wall thickening, the largest of which measures 5 mm in short axis are nonspecific but suspicious for early nodal involvement. ?3. Two tiny hypodense hepatic lesions measuring up to 6 mm, the ?larger of which was present on prior study dating back to January ?27, 2021, but the 4 mm is new since then, and both of which are too small to accurately characterize. While these almost certainly ?reflect a benign etiology, given that the 4 mm lesion in the right ?lobe of liver is new from prior, a hepatic metastases in this ?patient with colon cancer can not be completely excluded. Suggest ?attention on follow-up imaging. ?4. Otherwise, no definite evidence of distant metastatic disease in ?the chest, abdomen or pelvis. ?5. Diffuse hepatic steatosis. ?6. Punctate nonobstructive left lower pole renal calculus. ?7. Colonic diverticulosis  without findings of acute diverticulitis. ?  ?10/19/2021 Initial Diagnosis  ? Cancer of sigmoid ([place]) ? ?  ?10/19/2021 Cancer  Staging  ? Staging form: Colon and Rectum, AJCC 8th Edition ?- Pathologic stage from 10/19/2021: Stage IIIB (pT3, pN1b, cM0) - Signed by Truitt Merle, MD on 11/06/2021 ?Histologic grading system: 4 grade system ?Histologic grade (G): G2 ?Residual tumor (R): R0 - None ? ?  ?10/19/2021 Definitive Surgery  ? FINAL MICROSCOPIC DIAGNOSIS:  ? ?A.   LEFT COLON, RESECTION:  ?-    Adenocarcinoma, grade 2 (moderately differentiated), 4 cm in  ?greatest dimension, invasive through muscularis propria into pericolonic tissue.  ?-    All margins negative for tumor.  ?-    Metastatic carcinoma present in two of eighteen regional lymph  ?nodes (2/18).  ? ?B.   FINAL DISTAL MARGIN, EXCISION:  ?-    Negative for adenomatous change and malignancy.  ?  ?11/23/2021 -  Chemotherapy  ? Patient is on Treatment Plan : COLORECTAL Xelox (Capeox) q21d  ? ?  ?  ? ? ? ?INTERVAL HISTORY:  ?Jimmy [name] is here for a follow up of colon cancer. He was last seen by me on 12/01/21. He presents to the clinic alone. ?He reports he is recovering from first cycle chemo. He denies any numbness/tingling or bowel issues. ?  ?All other systems were reviewed with the patient and are negative. ? ?MEDICAL HISTORY:  ?Past Medical History:  ?Diagnosis Date  ? Anxiety   ? Cancer [facility])   ? GERD (gastroesophageal reflux disease)   ? History of kidney stones   ? Sleep apnea   ? ? ?SURGICAL HISTORY: ?Past Surgical History:  ?Procedure Laterality Date  ? EXTRACORPOREAL SHOCK WAVE LITHOTRIPSY Right 09/17/2019  ? Procedure: EXTRACORPOREAL SHOCK WAVE LITHOTRIPSY (ESWL);  Surgeon: Kathie Rhodes, MD;  Location: [facility];  Service: Urology;  Laterality: Right;  ? PROCTOSCOPY N/A 10/19/2021  ? Procedure: RIGIDI PROCTOSCOPY;  Surgeon: Oval Boston, MD;  Location: [facility];  Service: General;  Laterality: N/A;  ? WISDOM TOOTH EXTRACTION    ? ? ?I have reviewed the social history and family history with the patient and they are unchanged from previous  note. ? ?ALLERGIES:  has No Known Allergies. ? ?MEDICATIONS:  ?Current Outpatient Medications  ?Medication Sig Dispense Refill  ? amitriptyline (ELAVIL) 50 MG tablet Take 50 mg by mouth at bedtime.    ? amLODipine (NORVASC) 5 MG tablet Take 1 tablet (5 mg total) by mouth daily. 30 tablet 0  ? capecitabine (XELODA) 500 MG tablet Take 4 tablets (2,000 mg total) by mouth 2 (two) times daily after a meal. Take for 14 days, then off for 7 days. Repeat every 21 days. 112 tablet 1  ? ondansetron (ZOFRAN) 8 MG tablet Take 1 tablet (8 mg total) by mouth 2 (two) times daily as needed for refractory nausea / vomiting. Start on day 3 after chemotherapy. (Patient not taking: Reported on 11/23/2021) 30 tablet 1  ? prochlorperazine (COMPAZINE) 10 MG tablet Take 1 tablet (10 mg total) by mouth every 6 (six) hours as needed (Nausea or vomiting). (Patient not taking: Reported on 11/23/2021) 30 tablet 1  ? sertraline (ZOLOFT) 100 MG tablet Take 100 mg by mouth every evening.    ? testosterone cypionate (DEPOTESTOSTERONE CYPIONATE) 200 MG/ML injection Inject 0.5 mLs into the skin every 14 (fourteen) days.    ? traMADol (ULTRAM) 50 MG tablet Take 1-2 tablets (50-100 mg total) by mouth every 6 (six)  hours as needed for moderate pain or severe pain. 20 tablet 0  ? ?No current facility-administered medications for this visit.  ? ? ?PHYSICAL EXAMINATION: ?ECOG PERFORMANCE STATUS: 0 - Asymptomatic ? ?Vitals:  ? 12/13/21 0916  ?BP: 118/80  ?Pulse: 85  ?Resp: 17  ?Temp: 98.1 ?F (36.7 ?C)  ?SpO2: 97%  ? ?Wt Readings from Last 3 Encounters:  ?12/13/21 230 lb 12.8 oz (104.7 kg)  ?11/23/21 224 lb 6 oz (101.8 kg)  ?11/08/21 229 lb 9.6 oz (104.1 kg)  ?  ? ?GENERAL:alert, no distress and comfortable ?SKIN: skin color normal, no rashes or significant lesions ?EYES: normal, Conjunctiva are pink and non-injected, sclera clear  ?NEURO: alert & oriented x 3 with fluent speech ? ?LABORATORY DATA:  ?I have reviewed the data as listed ? ?  Latest Ref Rng &  Units 12/13/2021  ?  8:50 AM 11/23/2021  ?  8:53 AM 11/14/2021  ?  6:00 PM  ?CBC  ?WBC 4.0 - 10.5 K/uL 3.3   4.2   10.9    ?Hemoglobin 13.0 - 17.0 g/dL 13.6   14.3   15.0    ?Hematocrit 39.0 - 52.0 % 41.3   42.6   46.3    ?P

## 2021-12-13 NOTE — Patient Instructions (Signed)
[facility]COL[facility]  Discharge Instructions: Thank you for choosing [facility] to provide your oncology and hematology care.   If you have a lab appointment with the Cancer Center, please go directly to the Cancer Center and check in at the registration area.   Wear comfortable clothing and clothing appropriate for easy access to any Portacath or PICC line.   We strive to give you quality time with your provider. You may need to reschedule your appointment if you arrive late (15 or more minutes).  Arriving late affects you and other patients whose appointments are after yours.  Also, if you miss three or more appointments without notifying the office, you may be dismissed from the clinic at the provider's discretion.      For prescription refill requests, have your pharmacy contact our office and allow 72 hours for refills to be completed.    Today you received the following chemotherapy and/or immunotherapy agents: Oxaliplatin.       To help prevent nausea and vomiting after your treatment, we encourage you to take your nausea medication as directed.  BELOW ARE SYMPTOMS THAT SHOULD BE REPORTED IMMEDIATELY: *FEVER GREATER THAN 100.4 F (38 C) OR HIGHER *CHILLS OR SWEATING *NAUSEA AND VOMITING THAT IS NOT CONTROLLED WITH YOUR NAUSEA MEDICATION *UNUSUAL SHORTNESS OF BREATH *UNUSUAL BRUISING OR BLEEDING *URINARY PROBLEMS (pain or burning when urinating, or frequent urination) *BOWEL PROBLEMS (unusual diarrhea, constipation, pain near the anus) TENDERNESS IN MOUTH AND THROAT WITH OR WITHOUT PRESENCE OF ULCERS (sore throat, sores in mouth, or a toothache) UNUSUAL RASH, SWELLING OR PAIN  UNUSUAL VAGINAL DISCHARGE OR ITCHING   Items with * indicate a potential emergency and should be followed up as soon as possible or go to the Emergency Department if any problems should occur.  Please show the CHEMOTHERAPY ALERT CARD or IMMUNOTHERAPY ALERT CARD at check-in  to the Emergency Department and triage nurse.  Should you have questions after your visit or need to cancel or reschedule your appointment, please contact [facility]  Dept: [phone]  and follow the prompts.  Office hours are 8:00 a.m. to 4:30 p.m. Monday - Friday. Please note that voicemails left after 4:00 p.m. may not be returned until the following business day.  We are closed weekends and major holidays. You have access to a nurse at all times for urgent questions. Please call the main number to the clinic Dept: [phone] and follow the prompts.   For any non-urgent questions, you may also contact your provider using MyChart. We now offer e-Visits for anyone 18 and older to request care online for non-urgent symptoms. For details visit [email or web address].   Also download the MyChart app! Go to the app store, search "MyChart", open the app, select [facility], and log in with your MyChart username and password.  Due to Covid, a mask is required upon entering the hospital/clinic. If you do not have a mask, one will be given to you upon arrival. For doctor visits, patients may have 1 support person aged 18 or older with them. For treatment visits, patients cannot have anyone with them due to current Covid guidelines and our immunocompromised population.   

## 2021-12-31 NOTE — Progress Notes (Unsigned)
Jimmy [name]   Telephone:(336) [phone] [phone]   Clinic Follow up Note   Patient Care Team: Jimmy [name]., MD as PCP - General (Family Medicine) Jimmy Boston, MD as Consulting Physician (General Surgery) Jimmy Corner, MD as Consulting Physician (Gastroenterology) Revankar, Jimmy Cliche, MD as Consulting Physician (Cardiology) Jimmy Merle, MD as Consulting Physician (Oncology) Jimmy Bake, RN as Oncology Nurse Navigator (Oncology) 01/03/2022  CHIEF COMPLAINT: Follow up colon cancer   SUMMARY OF ONCOLOGIC HISTORY: Oncology History  Cancer of sigmoid colon (Jimmy [name])  08/14/2021 Procedure   Colonoscopy, Dr. Michail Sermon  Impression:  - Malignant partially obstructing tumor in the sigmoid colon. Biopsied. Tattooed.   08/14/2021 Initial Biopsy   INVASIVE MODERATELY DIFFERENTIATED ADENOCARCINOMA   09/20/2021 Imaging   EXAM: CT CHEST, ABDOMEN, AND PELVIS WITH CONTRAST  IMPRESSION: 1. Short segment focal wall thickening of the sigmoid colon, possibly reflecting patient's newly diagnosed colon cancer. 2. Prominent lymph nodes in the sigmoid mesentery, adjacent to the wall thickening, the largest of which measures 5 mm in short axis are nonspecific but suspicious for early nodal involvement. 3. Two tiny hypodense hepatic lesions measuring up to 6 mm, the larger of which was present on prior study dating back to September 02, 2019, but the 4 mm is new since then, and both of which are too small to accurately characterize. While these almost certainly reflect a benign etiology, given that the 4 mm lesion in the right lobe of liver is new from prior, a hepatic metastases in this patient with colon cancer can not be completely excluded. Suggest attention on follow-up imaging. 4. Otherwise, no definite evidence of distant metastatic disease in the chest, abdomen or pelvis. 5. Diffuse hepatic steatosis. 6. Punctate nonobstructive left lower pole renal calculus. 7.  Colonic diverticulosis without findings of acute diverticulitis.   10/19/2021 Initial Diagnosis   Cancer of sigmoid ([place])    10/19/2021 Cancer Staging   Staging form: Colon and Rectum, AJCC 8th Edition - Pathologic stage from 10/19/2021: Stage IIIB (pT3, pN1b, cM0) - Signed by Jimmy Merle, MD on 11/06/2021 Histologic grading system: 4 grade system Histologic grade (G): G2 Residual tumor (R): R0 - None    10/19/2021 Definitive Surgery   FINAL MICROSCOPIC DIAGNOSIS:   A.   LEFT COLON, RESECTION:  -    Adenocarcinoma, grade 2 (moderately differentiated), 4 cm in  greatest dimension, invasive through muscularis propria into pericolonic tissue.  -    All margins negative for tumor.  -    Metastatic carcinoma present in two of eighteen regional lymph  nodes (2/18).   B.   FINAL DISTAL MARGIN, EXCISION:  -    Negative for adenomatous change and malignancy.    11/23/2021 -  Chemotherapy   Patient is on Treatment Plan : COLORECTAL Xelox (Capeox) q21d        CURRENT THERAPY: Adjuvant CAPOX, q21d, started 11/23/21             -Xeloda dose: 2000 mg BID, days 1-14   INTERVAL HISTORY: Jimmy [name] returns for follow up as scheduled. Last seen by Jimmy [name] 12/13/21 and began cycle 2 CAPOX.  This cycle was "rough" worse than the first.  He had moderate nausea for 5-6 days, no vomiting.  He used Compazine only which was not very effective.  He had to force p.o. intake.  Energy dropped but remained able to work at least half days, remain out of bed and active at home but with more effort.  Cold sensitivity  lasts 8-9 days, no residual neuropathy in the absence of cold exposure.  After beginning chemo he developed a rash on his arms, occasionally his shoulders, and face that does not itch.  This eventually calm down and came back a little worse after cycle 2.  He did not have this prior to chemo.  After he completes the chemo pills he may drink a mixed drink or beer.  Otherwise doing well, bowels moving.  Denies  mucositis, hand/foot syndrome, fever, chills, cough, chest pain, dyspnea, leg edema, abdominal pain or bloating.  All other systems were reviewed with the patient and are negative.  MEDICAL HISTORY:  Past Medical History:  Diagnosis Date   Anxiety    Cancer (Jimmy [name])    GERD (gastroesophageal reflux disease)    History of kidney stones    Sleep apnea     SURGICAL HISTORY: Past Surgical History:  Procedure Laterality Date   EXTRACORPOREAL SHOCK WAVE LITHOTRIPSY Right 09/17/2019   Procedure: EXTRACORPOREAL SHOCK WAVE LITHOTRIPSY (ESWL);  Surgeon: Jimmy Rhodes, MD;  Location: [facility];  Service: Urology;  Laterality: Right;   PROCTOSCOPY N/A 10/19/2021   Procedure: RIGIDI PROCTOSCOPY;  Surgeon: Jimmy Boston, MD;  Location: [facility];  Service: General;  Laterality: N/A;   WISDOM TOOTH EXTRACTION      I have reviewed the social history and family history with the patient and they are unchanged from previous note.  ALLERGIES:  has No Known Allergies.  MEDICATIONS:  Current Outpatient Medications  Medication Sig Dispense Refill   ondansetron (ZOFRAN) 8 MG tablet Take 1 tablet (8 mg total) by mouth 2 (two) times daily as needed for refractory nausea / vomiting. Start on day 3 after chemotherapy. 30 tablet 1   prochlorperazine (COMPAZINE) 10 MG tablet Take 1 tablet (10 mg total) by mouth every 6 (six) hours as needed (Nausea or vomiting). 30 tablet 1   sertraline (ZOLOFT) 100 MG tablet Take 100 mg by mouth every evening.     amitriptyline (ELAVIL) 50 MG tablet Take 50 mg by mouth at bedtime.     amLODipine (NORVASC) 5 MG tablet Take 1 tablet (5 mg total) by mouth daily. 30 tablet 0   capecitabine (XELODA) 500 MG tablet Take 4 tablets (2,000 mg total) by mouth 2 (two) times daily after a meal. Take for 14 days, then off for 7 days. Repeat every 21 days. 112 tablet 1   testosterone cypionate (DEPOTESTOSTERONE CYPIONATE) 200 MG/ML injection Inject 0.5 mLs into the skin every 14  (fourteen) days.     traMADol (ULTRAM) 50 MG tablet Take 1-2 tablets (50-100 mg total) by mouth every 6 (six) hours as needed for moderate pain or severe pain. 20 tablet 0   No current facility-administered medications for this visit.   Facility-Administered Medications Ordered in Other Visits  Medication Dose Route Frequency Provider Last Rate Last Admin   dexamethasone (DECADRON) 10 mg in sodium chloride 0.9 % 50 mL IVPB  10 mg Intravenous Once Jimmy Merle, MD 204 mL/hr at 01/03/22 0958 10 mg at 01/03/22 0958   fosaprepitant (EMEND) 150 mg in sodium chloride 0.9 % 145 mL IVPB  150 mg Intravenous Once Alla Feeling, NP       oxaliplatin (ELOXATIN) 300 mg in dextrose 5 % 500 mL chemo infusion  129 mg/m2 (Treatment Plan Recorded) Intravenous Once Jimmy Merle, MD        PHYSICAL EXAMINATION: ECOG PERFORMANCE STATUS: 1 - Symptomatic but completely ambulatory  Vitals:   01/03/22 9450  BP: 126/88  Pulse: 62  Resp: 16  Temp: 97.8 F (36.6 C)  SpO2: 98%   Filed Weights   01/03/22 0856  Weight: 231 lb 3.2 oz (104.9 kg)    GENERAL:alert, no distress and comfortable SKIN: scattered mild maculopapular rash to forearms and upper chest/back.  Palms without erythema EYES: sclera clear NECK: supple, thyroid normal size, non-tender, without nodularity LUNGS: clear with normal breathing effort HEART: regular rate & rhythm, no lower extremity edema ABDOMEN:abdomen soft, non-tender and normal bowel sounds NEURO: alert & oriented x 3 with fluent speech, no focal motor/sensory deficits   LABORATORY DATA:  I have reviewed the data as listed    Latest Ref Rng & Units 01/03/2022    8:33 AM 12/13/2021    8:50 AM 11/23/2021    8:53 AM  CBC  WBC 4.0 - 10.5 K/uL 4.4   3.3   4.2    Hemoglobin 13.0 - 17.0 g/dL 13.9   13.6   14.3    Hematocrit 39.0 - 52.0 % 40.4   41.3   42.6    Platelets 150 - 400 K/uL 194   212   283          Latest Ref Rng & Units 01/03/2022    8:33 AM 12/13/2021    8:50 AM  11/23/2021    8:53 AM  CMP  Glucose 70 - 99 mg/dL 144   170   118    BUN 6 - 20 mg/dL 11   13   17     Creatinine 0.61 - 1.24 mg/dL 0.79   0.88   0.90    Sodium 135 - 145 mmol/L 139   140   139    Potassium 3.5 - 5.1 mmol/L 3.5   3.5   3.5    Chloride 98 - 111 mmol/L 107   108   105    CO2 22 - 32 mmol/L 25   25   25     Calcium 8.9 - 10.3 mg/dL 9.4   9.2   9.1    Total Protein 6.5 - 8.1 g/dL 7.0   7.4   7.3    Total Bilirubin 0.3 - 1.2 mg/dL 0.4   0.4   0.6    Alkaline Phos 38 - 126 U/L 88   77   70    AST 15 - 41 U/L 44   60   22    ALT 0 - 44 U/L 46   89   34        RADIOGRAPHIC STUDIES: I have personally reviewed the radiological images as listed and agreed with the findings in the report. No results found.   ASSESSMENT & PLAN: Jimmy [name] is a 51 y.o. male with    1. Cancer of Sigmoid Colon, moderately differentiated adenocarcinoma, stage IIIB p(T3, N1b), MMR normal  -presented with intermittent rectal bleeding for 6 months, first colonoscopy on 08/14/21 by Dr. Michail Sermon showed sigmoid colon mass, biopsy confirmed adenocarcinoma. -baseline CEA on 09/12/21 was WNL. -CT CAP on 09/20/21 showed: wall thickening of sigmoid colon; nonspecific but suspicious sigmoid mesentery lymph nodes; two indeterminate tiny hepatic lesions; otherwise no definite metastatic disease.  -left hemicolectomy on 10/19/21 by Dr. Johney Maine showed 4 cm adenocarcinoma, grade 2 (moderately differentiated), invasive through muscularis propria into pericolonic tissue; margins negative; metastatic carcinoma in two lymph nodes (2/18).  No lymphovascular or perineural invasion. -liver MRI on 11/24/21 showed benign cysts, no evidence of liver or other metastatic disease. -given  his high risk of recurrence, he was started on adjuvant CAPOX on 11/23/21. Plan for 4 cycles. He tolerated first cycle very well with only some expected cold sensitivity. -Mr. Gerads appears stable.  He completed 2 cycles of adjuvant CapeOx, tolerating  moderately well with fatigue, nausea x5-6 days, and cold sensitivity x8-9 days.  Nausea was partially managed with Compazine at home.  He has not lost weight.  He is otherwise able to recover and function well.  There is no evidence of disease recurrence. -We reviewed symptom management, I encouraged him to add Zofran on day 3.  Emend has been approved for premeds, we will give today.  -Labs reviewed, LFTs have improved.  Adequate to proceed with cycle 3 Cape ox today as planned, same dose -Follow-up in 3 weeks with cycle 4   2.  Mild transaminitis -Likely secondary to chemotherapy (?Oxali), total bilirubin normal today. -Improved today, adequate to continue treatment and monitor closely  3. Mild skin rash -onset after cycle 1 CapeOx, slightly worse after cycle 2; likely chemo related (more likely xeldoa) -mild on arms, shoulders, chest -improves after initial eruption but has not resolved -can use antihistamines such as benadryl, zyrtec at home.  -monitoring closely   Plan: -Labs reviewed, proceed with cycle 3 CapeOx today at same dose -Add Emend, continue Compazine and add Zofran at home -Antihistamines for skin rash -Reviewed precautions in general and while at the beach including skin protection, hydration, avoiding alcohol, etc. -F/up in 3 weeks with cycle 4   All questions were answered. The patient knows to call the clinic with any problems, questions or concerns. No barriers to learning was detected. I spent 20 minutes counseling the patient face to face. The total time spent in the appointment was 30 minutes and more than 50% was on counseling and review of test results     Alla Feeling, NP 01/03/22

## 2022-01-02 MED FILL — Dexamethasone Sodium Phosphate Inj 100 MG/10ML: INTRAMUSCULAR | Qty: 1 | Status: AC

## 2022-01-03 ENCOUNTER — Encounter: Payer: Self-pay | Admitting: Nurse Practitioner

## 2022-01-03 ENCOUNTER — Inpatient Hospital Stay: Payer: 59

## 2022-01-03 ENCOUNTER — Inpatient Hospital Stay: Payer: 59 | Admitting: Nurse Practitioner

## 2022-01-03 ENCOUNTER — Other Ambulatory Visit: Payer: Self-pay

## 2022-01-03 VITALS — BP 126/88 | HR 62 | Temp 97.8°F | Resp 16 | Ht 74.0 in | Wt 231.2 lb

## 2022-01-03 DIAGNOSIS — C187 Malignant neoplasm of sigmoid colon: Secondary | ICD-10-CM | POA: Diagnosis not present

## 2022-01-03 DIAGNOSIS — Z5111 Encounter for antineoplastic chemotherapy: Secondary | ICD-10-CM | POA: Diagnosis not present

## 2022-01-03 LAB — COMPREHENSIVE METABOLIC PANEL
ALT: 46 U/L — ABNORMAL HIGH (ref 0–44)
AST: 44 U/L — ABNORMAL HIGH (ref 15–41)
Albumin: 4 g/dL (ref 3.5–5.0)
Alkaline Phosphatase: 88 U/L (ref 38–126)
Anion gap: 7 (ref 5–15)
BUN: 11 mg/dL (ref 6–20)
CO2: 25 mmol/L (ref 22–32)
Calcium: 9.4 mg/dL (ref 8.9–10.3)
Chloride: 107 mmol/L (ref 98–111)
Creatinine, Ser: 0.79 mg/dL (ref 0.61–1.24)
GFR, Estimated: >60 mL/min (ref >60)
Glucose, Bld: 144 mg/dL — ABNORMAL HIGH (ref 70–99)
Potassium: 3.5 mmol/L (ref 3.5–5.1)
Sodium: 139 mmol/L (ref 135–145)
Total Bilirubin: 0.4 mg/dL (ref 0.3–1.2)
Total Protein: 7 g/dL (ref 6.5–8.1)

## 2022-01-03 LAB — CBC WITH DIFFERENTIAL/PLATELET
Abs Immature Granulocytes: 0.02 10*3/uL (ref 0.00–0.07)
Basophils Absolute: 0 10*3/uL (ref 0.0–0.1)
Basophils Relative: 1 %
Eosinophils Absolute: 0.5 10*3/uL (ref 0.0–0.5)
Eosinophils Relative: 11 %
HCT: 40.4 % (ref 39.0–52.0)
Hemoglobin: 13.9 g/dL (ref 13.0–17.0)
Immature Granulocytes: 1 %
Lymphocytes Relative: 19 %
Lymphs Abs: 0.8 10*3/uL (ref 0.7–4.0)
MCH: 32.2 pg (ref 26.0–34.0)
MCHC: 34.4 g/dL (ref 30.0–36.0)
MCV: 93.5 fL (ref 80.0–100.0)
Monocytes Absolute: 0.7 10*3/uL (ref 0.1–1.0)
Monocytes Relative: 16 %
Neutro Abs: 2.3 10*3/uL (ref 1.7–7.7)
Neutrophils Relative %: 52 %
Platelets: 194 10*3/uL (ref 150–400)
RBC: 4.32 MIL/uL (ref 4.22–5.81)
RDW: 18.2 % — ABNORMAL HIGH (ref 11.5–15.5)
WBC: 4.4 10*3/uL (ref 4.0–10.5)
nRBC: 0 % (ref 0.0–0.2)

## 2022-01-03 LAB — CEA (IN HOUSE-CHCC): CEA (CHCC-In House): 4.5 ng/mL (ref 0.00–5.00)

## 2022-01-03 MED ORDER — SODIUM CHLORIDE 0.9 % IV SOLN: 150.0000 mg | Freq: Once | INTRAVENOUS | Status: DC

## 2022-01-03 MED ORDER — PALONOSETRON HCL INJECTION 0.25 MG/5ML
0.2500 mg | Freq: Once | INTRAVENOUS | Status: AC
  Administered 2022-01-03: 0.25 mg via INTRAVENOUS
  Filled 2022-01-03: qty 5

## 2022-01-03 MED ORDER — OXALIPLATIN CHEMO INJECTION 100 MG/20ML
129.0000 mg/m2 | Freq: Once | INTRAVENOUS | Status: AC
  Administered 2022-01-03: 300 mg via INTRAVENOUS
  Filled 2022-01-03: qty 60

## 2022-01-03 MED ORDER — DEXTROSE 5 % IV SOLN: Freq: Once | INTRAVENOUS | Status: AC

## 2022-01-03 MED ORDER — SODIUM CHLORIDE 0.9 % IV SOLN
10.0000 mg | Freq: Once | INTRAVENOUS | Status: AC
  Administered 2022-01-03: 10 mg via INTRAVENOUS
  Filled 2022-01-03: qty 10

## 2022-01-03 MED ORDER — SODIUM CHLORIDE 0.9 % IV SOLN
150.0000 mg | Freq: Once | INTRAVENOUS | Status: AC
  Administered 2022-01-03: 150 mg via INTRAVENOUS
  Filled 2022-01-03: qty 150

## 2022-01-03 NOTE — Patient Instructions (Signed)
[facility]  Discharge Instructions: Thank you for choosing [facility] to provide your oncology and hematology care.   If you have a lab appointment with the Cancer Center, please go directly to the Cancer Center and check in at the registration area.   Wear comfortable clothing and clothing appropriate for easy access to any Portacath or PICC line.   We strive to give you quality time with your provider. You may need to reschedule your appointment if you arrive late (15 or more minutes).  Arriving late affects you and other patients whose appointments are after yours.  Also, if you miss three or more appointments without notifying the office, you may be dismissed from the clinic at the provider's discretion.      For prescription refill requests, have your pharmacy contact our office and allow 72 hours for refills to be completed.    Today you received the following chemotherapy and/or immunotherapy agents: Oxaliplatin.       To help prevent nausea and vomiting after your treatment, we encourage you to take your nausea medication as directed.  BELOW ARE SYMPTOMS THAT SHOULD BE REPORTED IMMEDIATELY: *FEVER GREATER THAN 100.4 F (38 C) OR HIGHER *CHILLS OR SWEATING *NAUSEA AND VOMITING THAT IS NOT CONTROLLED WITH YOUR NAUSEA MEDICATION *UNUSUAL SHORTNESS OF BREATH *UNUSUAL BRUISING OR BLEEDING *URINARY PROBLEMS (pain or burning when urinating, or frequent urination) *BOWEL PROBLEMS (unusual diarrhea, constipation, pain near the anus) TENDERNESS IN MOUTH AND THROAT WITH OR WITHOUT PRESENCE OF ULCERS (sore throat, sores in mouth, or a toothache) UNUSUAL RASH, SWELLING OR PAIN  UNUSUAL VAGINAL DISCHARGE OR ITCHING   Items with * indicate a potential emergency and should be followed up as soon as possible or go to the Emergency Department if any problems should occur.  Please show the CHEMOTHERAPY ALERT CARD or IMMUNOTHERAPY ALERT CARD at check-in  to the Emergency Department and triage nurse.  Should you have questions after your visit or need to cancel or reschedule your appointment, please contact [facility]  Dept: [phone]  and follow the prompts.  Office hours are 8:00 a.m. to 4:30 p.m. Monday - Friday. Please note that voicemails left after 4:00 p.m. may not be returned until the following business day.  We are closed weekends and major holidays. You have access to a nurse at all times for urgent questions. Please call the main number to the clinic Dept: [phone] and follow the prompts.   For any non-urgent questions, you may also contact your provider using MyChart. We now offer e-Visits for anyone 18 and older to request care online for non-urgent symptoms. For details visit [email or web address].   Also download the MyChart app! Go to the app store, search "MyChart", open the app, select [facility], and log in with your MyChart username and password.  Due to Covid, a mask is required upon entering the hospital/clinic. If you do not have a mask, one will be given to you upon arrival. For doctor visits, patients may have 1 support person aged 18 or older with them. For treatment visits, patients cannot have anyone with them due to current Covid guidelines and our immunocompromised population.   

## 2022-01-23 ENCOUNTER — Other Ambulatory Visit: Payer: Self-pay | Admitting: Hematology

## 2022-01-23 MED FILL — Fosaprepitant Dimeglumine For IV Infusion 150 MG (Base Eq): INTRAVENOUS | Qty: 5 | Status: AC

## 2022-01-23 MED FILL — Dexamethasone Sodium Phosphate Inj 100 MG/10ML: INTRAMUSCULAR | Qty: 1 | Status: AC

## 2022-01-24 ENCOUNTER — Inpatient Hospital Stay: Payer: 59

## 2022-01-24 ENCOUNTER — Encounter: Payer: Self-pay | Admitting: Hematology

## 2022-01-24 ENCOUNTER — Other Ambulatory Visit: Payer: Self-pay

## 2022-01-24 ENCOUNTER — Inpatient Hospital Stay: Payer: 59 | Attending: Hematology | Admitting: Hematology

## 2022-01-24 VITALS — BP 124/86 | HR 73 | Temp 98.2°F | Resp 17 | Ht 74.0 in | Wt 235.0 lb

## 2022-01-24 DIAGNOSIS — Z79899 Other long term (current) drug therapy: Secondary | ICD-10-CM | POA: Diagnosis not present

## 2022-01-24 DIAGNOSIS — C187 Malignant neoplasm of sigmoid colon: Secondary | ICD-10-CM | POA: Diagnosis present

## 2022-01-24 DIAGNOSIS — C787 Secondary malignant neoplasm of liver and intrahepatic bile duct: Secondary | ICD-10-CM | POA: Insufficient documentation

## 2022-01-24 DIAGNOSIS — C779 Secondary and unspecified malignant neoplasm of lymph node, unspecified: Secondary | ICD-10-CM | POA: Diagnosis not present

## 2022-01-24 DIAGNOSIS — Z5111 Encounter for antineoplastic chemotherapy: Secondary | ICD-10-CM | POA: Insufficient documentation

## 2022-01-24 LAB — CBC WITH DIFFERENTIAL/PLATELET
Abs Immature Granulocytes: 0.01 10*3/uL (ref 0.00–0.07)
Basophils Absolute: 0 10*3/uL (ref 0.0–0.1)
Basophils Relative: 1 %
Eosinophils Absolute: 0.6 10*3/uL — ABNORMAL HIGH (ref 0.0–0.5)
Eosinophils Relative: 15 %
HCT: 39.4 % (ref 39.0–52.0)
Hemoglobin: 13.8 g/dL (ref 13.0–17.0)
Immature Granulocytes: 0 %
Lymphocytes Relative: 21 %
Lymphs Abs: 0.8 10*3/uL (ref 0.7–4.0)
MCH: 34.4 pg — ABNORMAL HIGH (ref 26.0–34.0)
MCHC: 35 g/dL (ref 30.0–36.0)
MCV: 98.3 fL (ref 80.0–100.0)
Monocytes Absolute: 0.8 10*3/uL (ref 0.1–1.0)
Monocytes Relative: 21 %
Neutro Abs: 1.6 10*3/uL — ABNORMAL LOW (ref 1.7–7.7)
Neutrophils Relative %: 42 %
Platelets: 156 10*3/uL (ref 150–400)
RBC: 4.01 MIL/uL — ABNORMAL LOW (ref 4.22–5.81)
RDW: 22.5 % — ABNORMAL HIGH (ref 11.5–15.5)
WBC: 3.7 10*3/uL — ABNORMAL LOW (ref 4.0–10.5)
nRBC: 0 % (ref 0.0–0.2)

## 2022-01-24 LAB — COMPREHENSIVE METABOLIC PANEL
ALT: 35 U/L (ref 0–44)
AST: 44 U/L — ABNORMAL HIGH (ref 15–41)
Albumin: 3.9 g/dL (ref 3.5–5.0)
Alkaline Phosphatase: 94 U/L (ref 38–126)
Anion gap: 8 (ref 5–15)
BUN: 15 mg/dL (ref 6–20)
CO2: 25 mmol/L (ref 22–32)
Calcium: 9.3 mg/dL (ref 8.9–10.3)
Chloride: 105 mmol/L (ref 98–111)
Creatinine, Ser: 0.85 mg/dL (ref 0.61–1.24)
GFR, Estimated: >60 mL/min (ref >60)
Glucose, Bld: 151 mg/dL — ABNORMAL HIGH (ref 70–99)
Potassium: 3.3 mmol/L — ABNORMAL LOW (ref 3.5–5.1)
Sodium: 138 mmol/L (ref 135–145)
Total Bilirubin: 0.8 mg/dL (ref 0.3–1.2)
Total Protein: 7.2 g/dL (ref 6.5–8.1)

## 2022-01-24 LAB — CEA (IN HOUSE-CHCC): CEA (CHCC-In House): 4.56 ng/mL (ref 0.00–5.00)

## 2022-01-24 MED ORDER — DEXTROSE 5 % IV SOLN: Freq: Once | INTRAVENOUS | Status: AC

## 2022-01-24 MED ORDER — OXALIPLATIN CHEMO INJECTION 100 MG/20ML
130.0000 mg/m2 | Freq: Once | INTRAVENOUS | Status: AC
  Administered 2022-01-24: 305 mg via INTRAVENOUS
  Filled 2022-01-24: qty 61

## 2022-01-24 MED ORDER — SODIUM CHLORIDE 0.9 % IV SOLN
10.0000 mg | Freq: Once | INTRAVENOUS | Status: AC
  Administered 2022-01-24: 10 mg via INTRAVENOUS
  Filled 2022-01-24: qty 10

## 2022-01-24 MED ORDER — PALONOSETRON HCL INJECTION 0.25 MG/5ML
0.2500 mg | Freq: Once | INTRAVENOUS | Status: AC
  Administered 2022-01-24: 0.25 mg via INTRAVENOUS
  Filled 2022-01-24: qty 5

## 2022-01-24 MED ORDER — SODIUM CHLORIDE 0.9 % IV SOLN
150.0000 mg | Freq: Once | INTRAVENOUS | Status: AC
  Administered 2022-01-24: 150 mg via INTRAVENOUS
  Filled 2022-01-24: qty 150

## 2022-01-24 NOTE — Patient Instructions (Signed)
[facility]  Discharge Instructions: Thank you for choosing [facility] to provide your oncology and hematology care.   If you have a lab appointment with the [facility], please go directly to the [facility] and check in at the registration area.   Wear comfortable clothing and clothing appropriate for easy access to any Portacath or PICC line.   We strive to give you quality time with your provider. You may need to reschedule your appointment if you arrive late (15 or more minutes).  Arriving late affects you and other patients whose appointments are after yours.  Also, if you miss three or more appointments without notifying the office, you may be dismissed from the clinic at the provider's discretion.      For prescription refill requests, have your pharmacy contact our office and allow 72 hours for refills to be completed.    Today you received the following chemotherapy and/or immunotherapy agents oxaliplatin      To help prevent nausea and vomiting after your treatment, we encourage you to take your nausea medication as directed.  BELOW ARE SYMPTOMS THAT SHOULD BE REPORTED IMMEDIATELY: *FEVER GREATER THAN 100.4 F (38 C) OR HIGHER *CHILLS OR SWEATING *NAUSEA AND VOMITING THAT IS NOT CONTROLLED WITH YOUR NAUSEA MEDICATION *UNUSUAL SHORTNESS OF BREATH *UNUSUAL BRUISING OR BLEEDING *URINARY PROBLEMS (pain or burning when urinating, or frequent urination) *BOWEL PROBLEMS (unusual diarrhea, constipation, pain near the anus) TENDERNESS IN MOUTH AND THROAT WITH OR WITHOUT PRESENCE OF ULCERS (sore throat, sores in mouth, or a toothache) UNUSUAL RASH, SWELLING OR PAIN  UNUSUAL VAGINAL DISCHARGE OR ITCHING   Items with * indicate a potential emergency and should be followed up as soon as possible or go to the Emergency Department if any problems should occur.  Please show the CHEMOTHERAPY ALERT CARD or IMMUNOTHERAPY ALERT CARD at check-in to  the Emergency Department and triage nurse.  Should you have questions after your visit or need to cancel or reschedule your appointment, please contact [facility]  Dept: [phone]  and follow the prompts.  Office hours are 8:00 a.m. to 4:30 p.m. Monday - Friday. Please note that voicemails left after 4:00 p.m. may not be returned until the following business day.  We are closed weekends and major holidays. You have access to a nurse at all times for urgent questions. Please call the main number to the clinic Dept: [phone] and follow the prompts.   For any non-urgent questions, you may also contact your provider using MyChart. We now offer e-Visits for anyone 56 and older to request care online for non-urgent symptoms. For details visit [email or web address].   Also download the MyChart app! Go to the app store, search "MyChart", open the app, select [facility], and log in with your MyChart username and password.  Masks are optional in the cancer centers. If you would like for your care team to wear a mask while they are taking care of you, please let them know. For doctor visits, patients may have with them one support person who is at least 53 years old. At this time, visitors are not allowed in the infusion area.

## 2022-01-24 NOTE — Progress Notes (Signed)
[facility]   Telephone:(336) [phone] Fax:([phone]) [phone]   Clinic Follow up Note   Patient Care Team: Enid Skeens., MD as PCP - General (Family Medicine) Jaquez Boston, MD as Consulting Physician (General Surgery) Wilford Corner, MD as Consulting Physician (Gastroenterology) Revankar, Reita Cliche, MD as Consulting Physician (Cardiology) Truitt Merle, MD as Consulting Physician (Oncology) Royston Bake, RN as Oncology Nurse Navigator (Oncology)  Date of Service:  01/24/2022  CHIEF COMPLAINT: f/u of colon cancer  CURRENT THERAPY:  Adjuvant CAPOX, q21d, started 11/23/21             -Xeloda dose: 2000 mg BID, days 1-14  ASSESSMENT & PLAN:  RITA PROM is a 51 y.o. male with   1. Cancer of Sigmoid Colon, moderately differentiated adenocarcinoma, stage IIIB p(T3, N1b), MMR normal  -presented with intermittent rectal bleeding for 6 months, first colonoscopy on 08/14/21 by Dr. Michail Sermon showed sigmoid colon mass, biopsy confirmed adenocarcinoma. -baseline CEA on 09/12/21 was WNL. -CT CAP on 09/20/21 showed: wall thickening of sigmoid colon; nonspecific but suspicious sigmoid mesentery lymph nodes; two indeterminate tiny hepatic lesions; otherwise no definite metastatic disease.  -left hemicolectomy on 10/19/21 by Dr. Johney Maine showed 4 cm adenocarcinoma, grade 2 (moderately differentiated), invasive through muscularis propria into pericolonic tissue; margins negative; metastatic carcinoma in two lymph nodes (2/18).  No lymphovascular or perineural invasion. -liver MRI on 11/24/21 showed benign cysts, no evidence of liver or other metastatic disease. -given his high risk of recurrence, he was started on adjuvant CAPOX on 11/23/21. Plan for 4 cycles. He is tolerating moderately well with fatigue, nausea, and cold sensitivity. He reports it takes longer to recover with each cycle. Emend was added to premeds with cycle 3 which really helped . -labs reviewed, overall stable except potassium  3.3. Adequate to proceed with final cycle today at same dose    2.  Mild transaminitis -New 12/13/21, likely secondary to chemotherapy, total bilirubin normal today. -improving, ALT has normalized, AST only slightly elevated at 44 today (01/24/22)    PLAN:  -proceed with final oxali today at same full dose -continue Xeloda at same dose, he started this morning  -phone visit in 4-5 weeks on Friday -f/u in 2.5 months with lab and CT several days before   No problem-specific Assessment & Plan notes found for this encounter.   SUMMARY OF ONCOLOGIC HISTORY: Oncology History  Cancer of sigmoid colon ([place])  08/14/2021 Procedure   Colonoscopy, Dr. Michail Sermon  Impression:  - Malignant partially obstructing tumor in the sigmoid colon. Biopsied. Tattooed.   08/14/2021 Initial Biopsy   INVASIVE MODERATELY DIFFERENTIATED ADENOCARCINOMA   09/20/2021 Imaging   EXAM: CT CHEST, ABDOMEN, AND PELVIS WITH CONTRAST  IMPRESSION: 1. Short segment focal wall thickening of the sigmoid colon, possibly reflecting patient's newly diagnosed colon cancer. 2. Prominent lymph nodes in the sigmoid mesentery, adjacent to the wall thickening, the largest of which measures 5 mm in short axis are nonspecific but suspicious for early nodal involvement. 3. Two tiny hypodense hepatic lesions measuring up to 6 mm, the larger of which was present on prior study dating back to September 02, 2019, but the 4 mm is new since then, and both of which are too small to accurately characterize. While these almost certainly reflect a benign etiology, given that the 4 mm lesion in the right lobe of liver is new from prior, a hepatic metastases in this patient with colon cancer can not be completely excluded. Suggest attention on follow-up imaging.  4. Otherwise, no definite evidence of distant metastatic disease in the chest, abdomen or pelvis. 5. Diffuse hepatic steatosis. 6. Punctate nonobstructive left lower pole renal  calculus. 7. Colonic diverticulosis without findings of acute diverticulitis.   10/19/2021 Initial Diagnosis   Cancer of sigmoid ([place])   10/19/2021 Cancer Staging   Staging form: Colon and Rectum, AJCC 8th Edition - Pathologic stage from 10/19/2021: Stage IIIB (pT3, pN1b, cM0) - Signed by Truitt Merle, MD on 11/06/2021 Histologic grading system: 4 grade system Histologic grade (G): G2 Residual tumor (R): R0 - None   10/19/2021 Definitive Surgery   FINAL MICROSCOPIC DIAGNOSIS:   A.   LEFT COLON, RESECTION:  -    Adenocarcinoma, grade 2 (moderately differentiated), 4 cm in  greatest dimension, invasive through muscularis propria into pericolonic tissue.  -    All margins negative for tumor.  -    Metastatic carcinoma present in two of eighteen regional lymph  nodes (2/18).   B.   FINAL DISTAL MARGIN, EXCISION:  -    Negative for adenomatous change and malignancy.    11/23/2021 -  Chemotherapy   Patient is on Treatment Plan : COLORECTAL Xelox ([place]apeox) q21d        INTERVAL HISTORY:  Jimmy [name] is here for a follow up of colon cancer. He was last seen by NP Lacie on 01/03/22. He presents to the clinic alone. He reports things are stable but notes his side effects last longer with each treatment.   All other systems were reviewed with the patient and are negative.  MEDICAL HISTORY:  Past Medical History:  Diagnosis Date   Anxiety    Cancer ([place])    GERD (gastroesophageal reflux disease)    History of kidney stones    Sleep apnea     SURGICAL HISTORY: Past Surgical History:  Procedure Laterality Date   EXTRACORPOREAL SHOCK WAVE LITHOTRIPSY Right 09/17/2019   Procedure: EXTRACORPOREAL SHOCK WAVE LITHOTRIPSY (ESWL);  Surgeon: Kathie Rhodes, MD;  Location: [facility];  Service: Urology;  Laterality: Right;   PROCTOSCOPY N/A 10/19/2021   Procedure: RIGIDI PROCTOSCOPY;  Surgeon: Jayro Boston, MD;  Location: [facility];  Service: General;  Laterality: N/A;   WISDOM  TOOTH EXTRACTION      I have reviewed the social history and family history with the patient and they are unchanged from previous note.  ALLERGIES:  has No Known Allergies.  MEDICATIONS:  Current Outpatient Medications  Medication Sig Dispense Refill   amitriptyline (ELAVIL) 50 MG tablet Take 50 mg by mouth at bedtime.     amLODipine (NORVASC) 5 MG tablet Take 1 tablet (5 mg total) by mouth daily. 30 tablet 0   capecitabine (XELODA) 500 MG tablet Take 4 tablets (2,000 mg total) by mouth 2 (two) times daily after a meal. Take for 14 days, then off for 7 days. Repeat every 21 days. 112 tablet 1   ondansetron (ZOFRAN) 8 MG tablet Take 1 tablet (8 mg total) by mouth 2 (two) times daily as needed for refractory nausea / vomiting. Start on day 3 after chemotherapy. 30 tablet 1   prochlorperazine (COMPAZINE) 10 MG tablet Take 1 tablet (10 mg total) by mouth every 6 (six) hours as needed (Nausea or vomiting). 30 tablet 1   sertraline (ZOLOFT) 100 MG tablet Take 100 mg by mouth every evening.     testosterone cypionate (DEPOTESTOSTERONE CYPIONATE) 200 MG/ML injection Inject 0.5 mLs into the skin every 14 (fourteen) days.     traMADol Veatrice Bourbon)  50 MG tablet Take 1-2 tablets (50-100 mg total) by mouth every 6 (six) hours as needed for moderate pain or severe pain. 20 tablet 0   No current facility-administered medications for this visit.    PHYSICAL EXAMINATION: ECOG PERFORMANCE STATUS: 1 - Symptomatic but completely ambulatory  Vitals:   01/24/22 0855  BP: 124/86  Pulse: 73  Resp: 17  Temp: 98.2 F (36.8 C)  SpO2: 96%   Wt Readings from Last 3 Encounters:  01/24/22 235 lb (106.6 kg)  01/03/22 231 lb 3.2 oz (104.9 kg)  12/13/21 230 lb 12.8 oz (104.7 kg)     GENERAL:alert, no distress and comfortable SKIN: skin color, texture, turgor are normal, no rashes or significant lesions EYES: normal, Conjunctiva are pink and non-injected, sclera clear  LUNGS: clear to auscultation and percussion  with normal breathing effort HEART: regular rate & rhythm and no murmurs and no lower extremity edema ABDOMEN:abdomen soft, non-tender and normal bowel sounds Musculoskeletal:no cyanosis of digits and no clubbing  NEURO: alert & oriented x 3 with fluent speech, no focal motor/sensory deficits  LABORATORY DATA:  I have reviewed the data as listed    Latest Ref Rng & Units 01/24/2022    8:35 AM 01/03/2022    8:33 AM 12/13/2021    8:50 AM  CBC  WBC 4.0 - 10.5 K/uL 3.7  4.4  3.3   Hemoglobin 13.0 - 17.0 g/dL 13.8  13.9  13.6   Hematocrit 39.0 - 52.0 % 39.4  40.4  41.3   Platelets 150 - 400 K/uL 156  194  212         Latest Ref Rng & Units 01/24/2022    8:35 AM 01/03/2022    8:33 AM 12/13/2021    8:50 AM  CMP  Glucose 70 - 99 mg/dL 151  144  170   BUN 6 - 20 mg/dL _0 Creatinine 0.61 - 1.24 mg/dL 0.85  0.79  0.88   Sodium 135 - 145 mmol/L 138  139  140   Potassium 3.5 - 5.1 mmol/L 3.3  3.5  3.5   Chloride 98 - 111 mmol/L 105  107  108   CO2 22 - 32 mmol/L _1 Calcium 8.9 - 10.3 mg/dL 9.3  9.4  9.2   Total Protein 6.5 - 8.1 g/dL 7.2  7.0  7.4   Total Bilirubin 0.3 - 1.2 mg/dL 0.8  0.4  0.4   Alkaline Phos 38 - 126 U/L 94  88  77   AST 15 - 41 U/L 44  44  60   ALT 0 - 44 U/L 35  46  89       RADIOGRAPHIC STUDIES: I have personally reviewed the radiological images as listed and agreed with the findings in the report. No results found.    Orders Placed This Encounter  Procedures   CT CHEST ABDOMEN PELVIS W CONTRAST    Standing Status:   Future    Standing Expiration Date:   01/25/2023    Order Specific Question:   Preferred imaging location?    Answer:   [facility]    Order Specific Question:   Release to patient    Answer:   Immediate    Order Specific Question:   Is Oral Contrast requested for this exam?    Answer:   Yes, Per Radiology protocol   All questions were answered. The patient knows to call the  clinic with any problems, questions or  concerns. No barriers to learning was detected. The total time spent in the appointment was 30 minutes.     Truitt Merle, MD 01/24/2022   I, Wilburn Mylar, am acting as scribe for Truitt Merle, MD.   I have reviewed the above documentation for accuracy and completeness, and I agree with the above.

## 2022-02-01 ENCOUNTER — Other Ambulatory Visit: Payer: Self-pay | Admitting: Hematology

## 2022-02-01 DIAGNOSIS — C187 Malignant neoplasm of sigmoid colon: Secondary | ICD-10-CM

## 2022-02-01 NOTE — Telephone Encounter (Signed)
Pt has completed his 4 cycles of CAPOX

## 2022-02-26 ENCOUNTER — Other Ambulatory Visit: Payer: Self-pay

## 2022-03-02 ENCOUNTER — Encounter: Payer: Self-pay | Admitting: Hematology

## 2022-03-02 ENCOUNTER — Inpatient Hospital Stay: Payer: 59 | Attending: Hematology | Admitting: Hematology

## 2022-03-02 DIAGNOSIS — C187 Malignant neoplasm of sigmoid colon: Secondary | ICD-10-CM | POA: Diagnosis not present

## 2022-03-02 NOTE — Progress Notes (Signed)
[place]   [phone] [phone]) [phone]   Clinic Follow up Note   Patient Care Team: Enid Skeens., MD as PCP - General (Family Medicine) Wilberth Boston, MD as Consulting Physician (General Surgery) Wilford Corner, MD as Consulting Physician (Gastroenterology) Revankar, Reita Cliche, MD as Consulting Physician (Cardiology) Truitt Merle, MD as Consulting Physician (Oncology) Royston Bake, RN as Oncology Nurse Navigator (Oncology)  Date of Service:  03/02/2022  I connected with Tyrone Nine on 03/02/2022 at  1:20 PM EDT by telephone visit and verified that I am speaking with the correct person using two identifiers.  I discussed the limitations, risks, security and privacy concerns of performing an evaluation and management service by telephone and the availability of in person appointments. I also discussed with the patient that there may be a patient responsible charge related to this service. The patient expressed understanding and agreed to proceed.   Other persons participating in the visit and their role in the encounter:  none  Patient's location:  home Provider's location:  my office  CHIEF COMPLAINT: f/u of colon cancer  CURRENT THERAPY:  Surveillance  ASSESSMENT & PLAN:  [name] is a 51 y.o. male with   1. Cancer of Sigmoid Colon, moderately differentiated adenocarcinoma, stage IIIB p(T3, N1b), MMR normal  -presented with intermittent rectal bleeding for 6 months, first colonoscopy on 08/14/21 by Dr. Michail Sermon showed sigmoid colon mass, biopsy confirmed adenocarcinoma. -baseline CEA on 09/12/21 was WNL. -CT CAP on 09/20/21 showed: wall thickening of sigmoid colon; nonspecific but suspicious sigmoid mesentery lymph nodes; two indeterminate tiny hepatic lesions; otherwise no definite metastatic disease.  -left hemicolectomy on 10/19/21 by Dr. Johney Maine showed 4 cm adenocarcinoma, grade 2 (moderately differentiated), invasive through muscularis  propria into pericolonic tissue; margins negative; metastatic carcinoma in two lymph nodes (2/18).  No lymphovascular or perineural invasion. -liver MRI on 11/24/21 showed benign cysts, no evidence of liver or other metastatic disease. -he completed 4 cycles adjuvant CAPOX 11/23/21 - 01/24/22. He tolerated moderately well with fatigue, nausea, and cold sensitivity.  -he reports he is recovering well with no residual side effects and fatigue is improving.     PLAN:  -lab and CT 9/8 -f/u on 9/15   No problem-specific Assessment & Plan notes found for this encounter.   SUMMARY OF ONCOLOGIC HISTORY: Oncology History  Cancer of sigmoid colon ([place])  08/14/2021 Procedure   Colonoscopy, Dr. Michail Sermon  Impression:  - Malignant partially obstructing tumor in the sigmoid colon. Biopsied. Tattooed.   08/14/2021 Initial Biopsy   INVASIVE MODERATELY DIFFERENTIATED ADENOCARCINOMA   09/20/2021 Imaging   EXAM: CT CHEST, ABDOMEN, AND PELVIS WITH CONTRAST  IMPRESSION: 1. Short segment focal wall thickening of the sigmoid colon, possibly reflecting patient's newly diagnosed colon cancer. 2. Prominent lymph nodes in the sigmoid mesentery, adjacent to the wall thickening, the largest of which measures 5 mm in short axis are nonspecific but suspicious for early nodal involvement. 3. Two tiny hypodense hepatic lesions measuring up to 6 mm, the larger of which was present on prior study dating back to September 02, 2019, but the 4 mm is new since then, and both of which are too small to accurately characterize. While these almost certainly reflect a benign etiology, given that the 4 mm lesion in the right lobe of liver is new from prior, a hepatic metastases in this patient with colon cancer can not be completely excluded. Suggest attention on follow-up imaging. 4. Otherwise, no definite evidence of  distant metastatic disease in the chest, abdomen or pelvis. 5. Diffuse hepatic steatosis. 6. Punctate  nonobstructive left lower pole renal calculus. 7. Colonic diverticulosis without findings of acute diverticulitis.   10/19/2021 Initial Diagnosis   Cancer of sigmoid ([place])   10/19/2021 Cancer Staging   Staging form: Colon and Rectum, AJCC 8th Edition - Pathologic stage from 10/19/2021: Stage IIIB (pT3, pN1b, cM0) - Signed by Truitt Merle, MD on 11/06/2021 Histologic grading system: 4 grade system Histologic grade (G): G2 Residual tumor (R): R0 - None   10/19/2021 Definitive Surgery   FINAL MICROSCOPIC DIAGNOSIS:   A.   LEFT COLON, RESECTION:  -    Adenocarcinoma, grade 2 (moderately differentiated), 4 cm in  greatest dimension, invasive through muscularis propria into pericolonic tissue.  -    All margins negative for tumor.  -    Metastatic carcinoma present in two of eighteen regional lymph  nodes (2/18).   B.   FINAL DISTAL MARGIN, EXCISION:  -    Negative for adenomatous change and malignancy.    11/23/2021 -  Chemotherapy   Patient is on Treatment Plan : COLORECTAL Xelox (Capeox) q21d        INTERVAL HISTORY:  EMILLIANO [name] was contacted for a follow up of colon cancer. He was last seen by me on 01/24/22. He reports his energy is slowly recovering. He denies any residual numbness/tingling.    All other systems were reviewed with the patient and are negative.  MEDICAL HISTORY:  Past Medical History:  Diagnosis Date   Anxiety    Cancer ([place])    GERD (gastroesophageal reflux disease)    History of kidney stones    Sleep apnea     SURGICAL HISTORY: Past Surgical History:  Procedure Laterality Date   EXTRACORPOREAL SHOCK WAVE LITHOTRIPSY Right 09/17/2019   Procedure: EXTRACORPOREAL SHOCK WAVE LITHOTRIPSY (ESWL);  Surgeon: Kathie Rhodes, MD;  Location: [facility];  Service: Urology;  Laterality: Right;   PROCTOSCOPY N/A 10/19/2021   Procedure: RIGIDI PROCTOSCOPY;  Surgeon: Yandel Boston, MD;  Location: [facility];  Service: General;  Laterality: N/A;   WISDOM  TOOTH EXTRACTION      I have reviewed the social history and family history with the patient and they are unchanged from previous note.  ALLERGIES:  has No Known Allergies.  MEDICATIONS:  Current Outpatient Medications  Medication Sig Dispense Refill   amitriptyline (ELAVIL) 50 MG tablet Take 50 mg by mouth at bedtime.     amLODipine (NORVASC) 5 MG tablet Take 1 tablet (5 mg total) by mouth daily. 30 tablet 0   capecitabine (XELODA) 500 MG tablet Take 4 tablets (2,000 mg total) by mouth 2 (two) times daily after a meal. Take for 14 days, then off for 7 days. Repeat every 21 days. 112 tablet 1   ondansetron (ZOFRAN) 8 MG tablet Take 1 tablet (8 mg total) by mouth 2 (two) times daily as needed for refractory nausea / vomiting. Start on day 3 after chemotherapy. 30 tablet 1   prochlorperazine (COMPAZINE) 10 MG tablet Take 1 tablet (10 mg total) by mouth every 6 (six) hours as needed (Nausea or vomiting). 30 tablet 1   sertraline (ZOLOFT) 100 MG tablet Take 100 mg by mouth every evening.     testosterone cypionate (DEPOTESTOSTERONE CYPIONATE) 200 MG/ML injection Inject 0.5 mLs into the skin every 14 (fourteen) days.     traMADol (ULTRAM) 50 MG tablet Take 1-2 tablets (50-100 mg total) by mouth every 6 (six) hours  as needed for moderate pain or severe pain. 20 tablet 0   No current facility-administered medications for this visit.    PHYSICAL EXAMINATION: ECOG PERFORMANCE STATUS: 1 - Symptomatic but completely ambulatory  There were no vitals filed for this visit. Wt Readings from Last 3 Encounters:  01/24/22 235 lb (106.6 kg)  01/03/22 231 lb 3.2 oz (104.9 kg)  12/13/21 230 lb 12.8 oz (104.7 kg)     No vitals taken today, Exam not performed today  LABORATORY DATA:  I have reviewed the data as listed    Latest Ref Rng & Units 01/24/2022    8:35 AM 01/03/2022    8:33 AM 12/13/2021    8:50 AM  CBC  WBC 4.0 - 10.5 K/uL 3.7  4.4  3.3   Hemoglobin 13.0 - 17.0 g/dL 13.8  13.9  13.6    Hematocrit 39.0 - 52.0 % 39.4  40.4  41.3   Platelets 150 - 400 K/uL 156  194  212         Latest Ref Rng & Units 01/24/2022    8:35 AM 01/03/2022    8:33 AM 12/13/2021    8:50 AM  CMP  Glucose 70 - 99 mg/dL 151  144  170   BUN 6 - 20 mg/dL 15  11  13    Creatinine 0.61 - 1.24 mg/dL 0.85  0.79  0.88   Sodium 135 - 145 mmol/L 138  139  140   Potassium 3.5 - 5.1 mmol/L 3.3  3.5  3.5   Chloride 98 - 111 mmol/L 105  107  108   CO2 22 - 32 mmol/L 25  25  25    Calcium 8.9 - 10.3 mg/dL 9.3  9.4  9.2   Total Protein 6.5 - 8.1 g/dL 7.2  7.0  7.4   Total Bilirubin 0.3 - 1.2 mg/dL 0.8  0.4  0.4   Alkaline Phos 38 - 126 U/L 94  88  77   AST 15 - 41 U/L 44  44  60   ALT 0 - 44 U/L 35  46  89       RADIOGRAPHIC STUDIES: I have personally reviewed the radiological images as listed and agreed with the findings in the report. No results found.    No orders of the defined types were placed in this encounter.  All questions were answered. The patient knows to call the clinic with any problems, questions or concerns. No barriers to learning was detected. The total time spent in the appointment was 8 minutes.     Truitt Merle, MD 03/02/2022   I, Wilburn Mylar, am acting as scribe for Truitt Merle, MD.   I have reviewed the above documentation for accuracy and completeness, and I agree with the above.

## 2022-03-06 ENCOUNTER — Other Ambulatory Visit: Payer: Self-pay

## 2022-04-13 ENCOUNTER — Ambulatory Visit (HOSPITAL_COMMUNITY)
Admission: RE | Admit: 2022-04-13 | Discharge: 2022-04-13 | Disposition: A | Discharge status: Home / Self Care | Payer: 59 | Source: Ambulatory Visit | Attending: Hematology | Admitting: Hematology

## 2022-04-13 ENCOUNTER — Inpatient Hospital Stay: Payer: 59 | Attending: Hematology

## 2022-04-13 DIAGNOSIS — C187 Malignant neoplasm of sigmoid colon: Secondary | ICD-10-CM

## 2022-04-13 DIAGNOSIS — C772 Secondary and unspecified malignant neoplasm of intra-abdominal lymph nodes: Secondary | ICD-10-CM | POA: Insufficient documentation

## 2022-04-13 DIAGNOSIS — C787 Secondary malignant neoplasm of liver and intrahepatic bile duct: Secondary | ICD-10-CM | POA: Diagnosis not present

## 2022-04-13 LAB — CBC WITH DIFFERENTIAL/PLATELET
Abs Immature Granulocytes: 0.04 10*3/uL (ref 0.00–0.07)
Basophils Absolute: 0 10*3/uL (ref 0.0–0.1)
Basophils Relative: 1 %
Eosinophils Absolute: 0.3 10*3/uL (ref 0.0–0.5)
Eosinophils Relative: 4 %
HCT: 44 % (ref 39.0–52.0)
Hemoglobin: 14.9 g/dL (ref 13.0–17.0)
Immature Granulocytes: 1 %
Lymphocytes Relative: 20 %
Lymphs Abs: 1.3 10*3/uL (ref 0.7–4.0)
MCH: 34.3 pg — ABNORMAL HIGH (ref 26.0–34.0)
MCHC: 33.9 g/dL (ref 30.0–36.0)
MCV: 101.4 fL — ABNORMAL HIGH (ref 80.0–100.0)
Monocytes Absolute: 0.8 10*3/uL (ref 0.1–1.0)
Monocytes Relative: 12 %
Neutro Abs: 4 10*3/uL (ref 1.7–7.7)
Neutrophils Relative %: 62 %
Platelets: 247 10*3/uL (ref 150–400)
RBC: 4.34 MIL/uL (ref 4.22–5.81)
RDW: 12.7 % (ref 11.5–15.5)
WBC: 6.5 10*3/uL (ref 4.0–10.5)
nRBC: 0 % (ref 0.0–0.2)

## 2022-04-13 LAB — COMPREHENSIVE METABOLIC PANEL
ALT: 21 U/L (ref 0–44)
AST: 23 U/L (ref 15–41)
Albumin: 4.4 g/dL (ref 3.5–5.0)
Alkaline Phosphatase: 76 U/L (ref 38–126)
Anion gap: 4 — ABNORMAL LOW (ref 5–15)
BUN: 15 mg/dL (ref 6–20)
CO2: 28 mmol/L (ref 22–32)
Calcium: 9.4 mg/dL (ref 8.9–10.3)
Chloride: 104 mmol/L (ref 98–111)
Creatinine, Ser: 0.87 mg/dL (ref 0.61–1.24)
GFR, Estimated: >60 mL/min (ref >60)
Glucose, Bld: 89 mg/dL (ref 70–99)
Potassium: 4 mmol/L (ref 3.5–5.1)
Sodium: 136 mmol/L (ref 135–145)
Total Bilirubin: 0.7 mg/dL (ref 0.3–1.2)
Total Protein: 8 g/dL (ref 6.5–8.1)

## 2022-04-13 LAB — CEA (IN HOUSE-CHCC): CEA (CHCC-In House): 3.3 ng/mL (ref 0.00–5.00)

## 2022-04-13 MED ORDER — SODIUM CHLORIDE (PF) 0.9 % IJ SOLN
INTRAMUSCULAR | Status: AC
  Filled 2022-04-13: qty 50

## 2022-04-13 MED ORDER — IOHEXOL 300 MG/ML  SOLN
100.0000 mL | Freq: Once | INTRAMUSCULAR | Status: AC | PRN
  Administered 2022-04-13: 100 mL via INTRAVENOUS

## 2022-04-20 ENCOUNTER — Other Ambulatory Visit: Payer: Self-pay

## 2022-04-20 ENCOUNTER — Inpatient Hospital Stay: Payer: 59 | Admitting: Hematology

## 2022-04-20 ENCOUNTER — Encounter: Payer: Self-pay | Admitting: Hematology

## 2022-04-20 VITALS — BP 130/91 | HR 82 | Temp 98.0°F | Resp 18 | Ht 74.0 in | Wt 235.4 lb

## 2022-04-20 DIAGNOSIS — C187 Malignant neoplasm of sigmoid colon: Secondary | ICD-10-CM | POA: Diagnosis not present

## 2022-04-20 NOTE — Progress Notes (Signed)
[place]   [phone]) [phone] Fax:(336) [phone]   Clinic Follow up Note   Patient Care Team: Enid Skeens., MD as PCP - General (Family Medicine) Hance Boston, MD as Consulting Physician (General Surgery) Wilford Corner, MD as Consulting Physician (Gastroenterology) Revankar, Reita Cliche, MD as Consulting Physician (Cardiology) Truitt Merle, MD as Consulting Physician (Oncology) Royston Bake, RN as Oncology Nurse Navigator (Oncology)  Date of Service:  04/20/2022  CHIEF COMPLAINT: f/u of colon cancer  CURRENT THERAPY:  Surveillance  ASSESSMENT & PLAN:  Jimmy [name] is a 51 y.o. male with   1. Cancer of Sigmoid Colon, moderately differentiated adenocarcinoma, stage IIIB p(T3, N1b), MMR normal  -presented with intermittent rectal bleeding for 6 months, first colonoscopy on 08/14/21 by Dr. Michail Sermon showed sigmoid colon mass, biopsy confirmed adenocarcinoma. -baseline CEA on 09/12/21 was WNL. -CT CAP on 09/20/21 showed: wall thickening of sigmoid colon; nonspecific but suspicious sigmoid mesentery lymph nodes; two indeterminate tiny hepatic lesions; otherwise no definite metastatic disease.  -left hemicolectomy on 10/19/21 by Dr. Johney Maine showed 4 cm adenocarcinoma, grade 2 (moderately differentiated), invasive through muscularis propria into pericolonic tissue; margins negative; metastatic carcinoma in two lymph nodes (2/18).  No lymphovascular or perineural invasion. -liver MRI on 11/24/21 showed benign cysts, no evidence of liver or other metastatic disease. -he completed 4 cycles adjuvant CAPOX 11/23/21 - 01/24/22. He tolerated moderately well with fatigue, nausea, and cold sensitivity.  -Surveillant CT CAP 04/13/22 showed NED. I reviewed the results with him today. Plan for repeat in 6 months, then annually. -he reports he has recovered completely. Lab from 9/8 reviewed, overall WNL.      PLAN:  -lab and f/u with NP in 3 months  -will order next scan at that  visit   No problem-specific Assessment & Plan notes found for this encounter.   SUMMARY OF ONCOLOGIC HISTORY: Oncology History  Cancer of sigmoid colon ([place])  08/14/2021 Procedure   Colonoscopy, Dr. Michail Sermon  Impression:  - Malignant partially obstructing tumor in the sigmoid colon. Biopsied. Tattooed.   08/14/2021 Initial Biopsy   INVASIVE MODERATELY DIFFERENTIATED ADENOCARCINOMA   09/20/2021 Imaging   EXAM: CT CHEST, ABDOMEN, AND PELVIS WITH CONTRAST  IMPRESSION: 1. Short segment focal wall thickening of the sigmoid colon, possibly reflecting patient's newly diagnosed colon cancer. 2. Prominent lymph nodes in the sigmoid mesentery, adjacent to the wall thickening, the largest of which measures 5 mm in short axis are nonspecific but suspicious for early nodal involvement. 3. Two tiny hypodense hepatic lesions measuring up to 6 mm, the larger of which was present on prior study dating back to September 02, 2019, but the 4 mm is new since then, and both of which are too small to accurately characterize. While these almost certainly reflect a benign etiology, given that the 4 mm lesion in the right lobe of liver is new from prior, a hepatic metastases in this patient with colon cancer can not be completely excluded. Suggest attention on follow-up imaging. 4. Otherwise, no definite evidence of distant metastatic disease in the chest, abdomen or pelvis. 5. Diffuse hepatic steatosis. 6. Punctate nonobstructive left lower pole renal calculus. 7. Colonic diverticulosis without findings of acute diverticulitis.   10/19/2021 Initial Diagnosis   Cancer of sigmoid ([place])   10/19/2021 Cancer Staging   Staging form: Colon and Rectum, AJCC 8th Edition - Pathologic stage from 10/19/2021: Stage IIIB (pT3, pN1b, cM0) - Signed by Truitt Merle, MD on 11/06/2021 Histologic grading system: 4 grade system Histologic  grade (G): G2 Residual tumor (R): R0 - None   10/19/2021 Definitive Surgery   FINAL  MICROSCOPIC DIAGNOSIS:   A.   LEFT COLON, RESECTION:  -    Adenocarcinoma, grade 2 (moderately differentiated), 4 cm in  greatest dimension, invasive through muscularis propria into pericolonic tissue.  -    All margins negative for tumor.  -    Metastatic carcinoma present in two of eighteen regional lymph  nodes (2/18).   B.   FINAL DISTAL MARGIN, EXCISION:  -    Negative for adenomatous change and malignancy.    11/23/2021 -  Chemotherapy   Patient is on Treatment Plan : COLORECTAL Xelox (Capeox) q21d        INTERVAL HISTORY:  Jimmy [name] is here for a follow up of colon cancer. He was last seen by me on 03/02/22. He presents to the clinic alone. He reports he has recovered from chemo-- no stomach issues, no bowel disturbances, appetite back to normal.   All other systems were reviewed with the patient and are negative.  MEDICAL HISTORY:  Past Medical History:  Diagnosis Date   Anxiety    Cancer ([place])    GERD (gastroesophageal reflux disease)    History of kidney stones    Sleep apnea     SURGICAL HISTORY: Past Surgical History:  Procedure Laterality Date   EXTRACORPOREAL SHOCK WAVE LITHOTRIPSY Right 09/17/2019   Procedure: EXTRACORPOREAL SHOCK WAVE LITHOTRIPSY (ESWL);  Surgeon: Kathie Rhodes, MD;  Location: [facility];  Service: Urology;  Laterality: Right;   PROCTOSCOPY N/A 10/19/2021   Procedure: RIGIDI PROCTOSCOPY;  Surgeon: Kennett Boston, MD;  Location: [facility];  Service: General;  Laterality: N/A;   WISDOM TOOTH EXTRACTION      I have reviewed the social history and family history with the patient and they are unchanged from previous note.  ALLERGIES:  has No Known Allergies.  MEDICATIONS:  Current Outpatient Medications  Medication Sig Dispense Refill   amitriptyline (ELAVIL) 50 MG tablet Take 50 mg by mouth at bedtime.     sertraline (ZOLOFT) 100 MG tablet Take 100 mg by mouth every evening.     testosterone cypionate (DEPOTESTOSTERONE  CYPIONATE) 200 MG/ML injection Inject 0.5 mLs into the skin every 14 (fourteen) days.     traMADol (ULTRAM) 50 MG tablet Take 1-2 tablets (50-100 mg total) by mouth every 6 (six) hours as needed for moderate pain or severe pain. 20 tablet 0   No current facility-administered medications for this visit.    PHYSICAL EXAMINATION: ECOG PERFORMANCE STATUS: 0 - Asymptomatic  Vitals:   04/20/22 1323  BP: (!) 130/91  Pulse: 82  Resp: 18  Temp: 98 F (36.7 C)  SpO2: 97%   Wt Readings from Last 3 Encounters:  04/20/22 235 lb 6.4 oz (106.8 kg)  01/24/22 235 lb (106.6 kg)  01/03/22 231 lb 3.2 oz (104.9 kg)     GENERAL:alert, no distress and comfortable SKIN: skin color normal, no rashes or significant lesions EYES: normal, Conjunctiva are pink and non-injected, sclera clear  NEURO: alert & oriented x 3 with fluent speech  LABORATORY DATA:  I have reviewed the data as listed    Latest Ref Rng & Units 04/13/2022    1:24 PM 01/24/2022    8:35 AM 01/03/2022    8:33 AM  CBC  WBC 4.0 - 10.5 K/uL 6.5  3.7  4.4   Hemoglobin 13.0 - 17.0 g/dL 14.9  13.8  13.9   Hematocrit 39.0 -  52.0 % 44.0  39.4  40.4   Platelets 150 - 400 K/uL 247  156  194         Latest Ref Rng & Units 04/13/2022    1:24 PM 01/24/2022    8:35 AM 01/03/2022    8:33 AM  CMP  Glucose 70 - 99 mg/dL 89  151  144   BUN 6 - 20 mg/dL 15  15  11    Creatinine 0.61 - 1.24 mg/dL 0.87  0.85  0.79   Sodium 135 - 145 mmol/L 136  138  139   Potassium 3.5 - 5.1 mmol/L 4.0  3.3  3.5   Chloride 98 - 111 mmol/L 104  105  107   CO2 22 - 32 mmol/L 28  25  25    Calcium 8.9 - 10.3 mg/dL 9.4  9.3  9.4   Total Protein 6.5 - 8.1 g/dL 8.0  7.2  7.0   Total Bilirubin 0.3 - 1.2 mg/dL 0.7  0.8  0.4   Alkaline Phos 38 - 126 U/L 76  94  88   AST 15 - 41 U/L 23  44  44   ALT 0 - 44 U/L 21  35  46       RADIOGRAPHIC STUDIES: I have personally reviewed the radiological images as listed and agreed with the findings in the report. No results  found.    No orders of the defined types were placed in this encounter.  All questions were answered. The patient knows to call the clinic with any problems, questions or concerns. No barriers to learning was detected. The total time spent in the appointment was 25 minutes.     Truitt Merle, MD 04/20/2022   I, Wilburn Mylar, am acting as scribe for Truitt Merle, MD.   I have reviewed the above documentation for accuracy and completeness, and I agree with the above.

## 2022-04-21 ENCOUNTER — Other Ambulatory Visit: Payer: Self-pay

## 2022-04-22 ENCOUNTER — Other Ambulatory Visit: Payer: Self-pay

## 2022-07-19 ENCOUNTER — Inpatient Hospital Stay: Payer: 59 | Attending: Hematology | Admitting: Physician Assistant

## 2022-07-19 ENCOUNTER — Inpatient Hospital Stay: Payer: 59

## 2022-07-19 ENCOUNTER — Other Ambulatory Visit: Payer: Self-pay

## 2022-07-19 VITALS — BP 149/89 | HR 86 | Temp 98.2°F | Resp 14 | Wt 239.9 lb

## 2022-07-19 DIAGNOSIS — C7989 Secondary malignant neoplasm of other specified sites: Secondary | ICD-10-CM | POA: Insufficient documentation

## 2022-07-19 DIAGNOSIS — C787 Secondary malignant neoplasm of liver and intrahepatic bile duct: Secondary | ICD-10-CM | POA: Insufficient documentation

## 2022-07-19 DIAGNOSIS — C187 Malignant neoplasm of sigmoid colon: Secondary | ICD-10-CM

## 2022-07-19 LAB — CBC WITH DIFFERENTIAL/PLATELET
Abs Immature Granulocytes: 0.02 10*3/uL (ref 0.00–0.07)
Basophils Absolute: 0 10*3/uL (ref 0.0–0.1)
Basophils Relative: 1 %
Eosinophils Absolute: 0.2 10*3/uL (ref 0.0–0.5)
Eosinophils Relative: 3 %
HCT: 44 % (ref 39.0–52.0)
Hemoglobin: 14.7 g/dL (ref 13.0–17.0)
Immature Granulocytes: 0 %
Lymphocytes Relative: 23 %
Lymphs Abs: 1.4 10*3/uL (ref 0.7–4.0)
MCH: 31.2 pg (ref 26.0–34.0)
MCHC: 33.4 g/dL (ref 30.0–36.0)
MCV: 93.4 fL (ref 80.0–100.0)
Monocytes Absolute: 0.6 10*3/uL (ref 0.1–1.0)
Monocytes Relative: 11 %
Neutro Abs: 3.6 10*3/uL (ref 1.7–7.7)
Neutrophils Relative %: 62 %
Platelets: 232 10*3/uL (ref 150–400)
RBC: 4.71 MIL/uL (ref 4.22–5.81)
RDW: 13 % (ref 11.5–15.5)
WBC: 5.9 10*3/uL (ref 4.0–10.5)
nRBC: 0 % (ref 0.0–0.2)

## 2022-07-19 LAB — COMPREHENSIVE METABOLIC PANEL
ALT: 29 U/L (ref 0–44)
AST: 29 U/L (ref 15–41)
Albumin: 4.3 g/dL (ref 3.5–5.0)
Alkaline Phosphatase: 79 U/L (ref 38–126)
Anion gap: 6 (ref 5–15)
BUN: 7 mg/dL (ref 6–20)
CO2: 31 mmol/L (ref 22–32)
Calcium: 9.7 mg/dL (ref 8.9–10.3)
Chloride: 104 mmol/L (ref 98–111)
Creatinine, Ser: 0.96 mg/dL (ref 0.61–1.24)
GFR, Estimated: >60 mL/min (ref >60)
Glucose, Bld: 101 mg/dL — ABNORMAL HIGH (ref 70–99)
Potassium: 4.1 mmol/L (ref 3.5–5.1)
Sodium: 141 mmol/L (ref 135–145)
Total Bilirubin: 0.7 mg/dL (ref 0.3–1.2)
Total Protein: 7.2 g/dL (ref 6.5–8.1)

## 2022-07-19 LAB — CEA (IN HOUSE-CHCC): CEA (CHCC-In House): 2.19 ng/mL (ref 0.00–5.00)

## 2022-07-19 NOTE — Progress Notes (Signed)
[place] OFFICE PROGRESS NOTE  Jimmy [name]., MD [address] [place] [address]  DIAGNOSIS: Colon Cancer   Oncology History  Cancer of sigmoid colon ([place])  08/14/2021 Procedure   Colonoscopy, Dr. Michail [name]  Impression:  - Malignant partially obstructing tumor in the sigmoid colon. Biopsied. Tattooed.   08/14/2021 Initial Biopsy   INVASIVE MODERATELY DIFFERENTIATED ADENOCARCINOMA   09/20/2021 Imaging   EXAM: CT CHEST, ABDOMEN, AND PELVIS WITH CONTRAST  IMPRESSION: 1. Short segment focal wall thickening of the sigmoid colon, possibly reflecting patient's newly diagnosed colon cancer. 2. Prominent lymph nodes in the sigmoid mesentery, adjacent to the wall thickening, the largest of which measures 5 mm in short axis are nonspecific but suspicious for early nodal involvement. 3. Two tiny hypodense hepatic lesions measuring up to 6 mm, the larger of which was present on prior study dating back to September 02, 2019, but the 4 mm is new since then, and both of which are too small to accurately characterize. While these almost certainly reflect a benign etiology, given that the 4 mm lesion in the right lobe of liver is new from prior, a hepatic metastases in this patient with colon cancer can not be completely excluded. Suggest attention on follow-up imaging. 4. Otherwise, no definite evidence of distant metastatic disease in the chest, abdomen or pelvis. 5. Diffuse hepatic steatosis. 6. Punctate nonobstructive left lower pole renal calculus. 7. Colonic diverticulosis without findings of acute diverticulitis.   10/19/2021 Initial Diagnosis   Cancer of sigmoid ([place])   10/19/2021 Cancer Staging   Staging form: Colon and Rectum, AJCC 8th Edition - Pathologic stage from 10/19/2021: Stage IIIB (pT3, pN1b, cM0) - Signed by Jimmy Merle, MD on 11/06/2021 Histologic grading system: 4 grade system Histologic grade (G): G2 Residual tumor (R): R0 - None   10/19/2021  Definitive Surgery   FINAL MICROSCOPIC DIAGNOSIS:   A.   LEFT COLON, RESECTION:  -    Adenocarcinoma, grade 2 (moderately differentiated), 4 cm in  greatest dimension, invasive through muscularis propria into pericolonic tissue.  -    All margins negative for tumor.  -    Metastatic carcinoma present in two of eighteen regional lymph  nodes (2/18).   B.   FINAL DISTAL MARGIN, EXCISION:  -    Negative for adenomatous change and malignancy.    11/23/2021 - 01/24/2022 Chemotherapy   Patient is on Treatment Plan : COLORECTAL Xelox (Capeox) q21d       CURRENT THERAPY: Surveillance   INTERVAL HISTORY: Jimmy [name] 51 y.o. [name] returns to the clinic today for a 37-monthfollow-up visit.  He was last seen by Dr. FBurr [name] 04/20/22. He had completed 4 cycles of adjuvant CAPOX in June 2023.  He has been on observation at that time and doing well.  He denies any fever, chills, night sweats, or unexplained weight loss.  Denies any odynophagia or dysphagia.  Denies any early satiety, abdominal bloating, or abdominal pain.  He denies any jaundice or itching.  He denies any nausea, vomiting, diarrhea, or constipation.  He denies any rectal bleeding or black tarry stools.  Denies any shortness of breath or cough.  He is here today for evaluation and repeat blood work.     MEDICAL HISTORY: Past Medical History:  Diagnosis Date   Anxiety    [place]    GERD (gastroesophageal reflux disease)    History of kidney stones    Sleep apnea     ALLERGIES:  has No Known Allergies.  MEDICATIONS:  Current Outpatient Medications  Medication Sig Dispense Refill   amitriptyline (ELAVIL) 50 MG tablet Take 50 mg by mouth at bedtime.     sertraline (ZOLOFT) 100 MG tablet Take 100 mg by mouth every evening.     testosterone cypionate (DEPOTESTOSTERONE CYPIONATE) 200 MG/ML injection Inject 0.5 mLs into the skin every 14 (fourteen) days.     No current facility-administered medications for this visit.     SURGICAL HISTORY:  Past Surgical History:  Procedure Laterality Date   EXTRACORPOREAL SHOCK WAVE LITHOTRIPSY Right 09/17/2019   Procedure: EXTRACORPOREAL SHOCK WAVE LITHOTRIPSY (ESWL);  Surgeon: Jimmy Rhodes, MD;  Location: [facility];  Service: Urology;  Laterality: Right;   PROCTOSCOPY N/A 10/19/2021   Procedure: RIGIDI PROCTOSCOPY;  Surgeon: Jimmy Boston, MD;  Location: [facility];  Service: General;  Laterality: N/A;   WISDOM TOOTH EXTRACTION      REVIEW OF SYSTEMS:   Review of Systems  Constitutional: Negative for appetite change, chills, fatigue, fever and unexpected weight change.  HENT:   Negative for mouth sores, nosebleeds, sore throat and trouble swallowing.   Eyes: Negative for eye problems and icterus.  Respiratory: Negative for cough, hemoptysis, shortness of breath and wheezing.   Cardiovascular: Negative for chest pain and leg swelling.  Gastrointestinal: Negative for abdominal pain, constipation, diarrhea, nausea and vomiting.  Genitourinary: Negative for bladder incontinence, difficulty urinating, dysuria, frequency and hematuria.   Musculoskeletal: Negative for back pain, gait problem, neck pain and neck stiffness.  Skin: Negative for itching and rash.  Neurological: Negative for dizziness, extremity weakness, gait problem, headaches, light-headedness and seizures.  Hematological: Negative for adenopathy. Does not bruise/bleed easily.  Psychiatric/Behavioral: Negative for confusion, depression and sleep disturbance. The patient is not nervous/anxious.     PHYSICAL EXAMINATION:  Blood pressure (!) 149/89, pulse 86, temperature 98.2 F (36.8 C), temperature source Oral, resp. rate 14, weight 239 lb 14.4 oz (108.8 kg), SpO2 98 %.  ECOG PERFORMANCE STATUS: 0  Physical Exam  Constitutional: Oriented to person, place, and time and well-developed, well-nourished, and in no distress. HENT:  Head: Normocephalic and atraumatic.  Mouth/Throat: Oropharynx  is clear and moist. No oropharyngeal exudate.  Eyes: Conjunctivae are normal. Right eye exhibits no discharge. Left eye exhibits no discharge. No scleral icterus.  Neck: Normal range of motion. Neck supple.  Cardiovascular: Normal rate, regular rhythm, normal heart sounds and intact distal pulses.   Pulmonary/Chest: Effort normal and breath sounds normal. No respiratory distress. No wheezes. No rales.  Abdominal: Soft. Bowel sounds are normal. Exhibits no distension and no mass. There is no tenderness.  Musculoskeletal: Normal range of motion. Exhibits no edema.  Lymphadenopathy:    No cervical adenopathy.  Neurological: Alert and oriented to person, place, and time. Exhibits normal muscle tone. Gait normal. Coordination normal.  Skin: Skin is warm and dry. No rash noted. Not diaphoretic. No erythema. No pallor.  Psychiatric: Mood, memory and judgment normal.  Vitals reviewed.  LABORATORY DATA: Lab Results  Component Value Date   WBC 5.9 07/19/2022   HGB 14.7 07/19/2022   HCT 44.0 07/19/2022   MCV 93.4 07/19/2022   PLT 232 07/19/2022      Chemistry      Component Value Date/Time   NA 141 07/19/2022 1350   K 4.1 07/19/2022 1350   CL 104 07/19/2022 1350   CO2 31 07/19/2022 1350   BUN 7 07/19/2022 1350   CREATININE 0.96 07/19/2022 1350   CREATININE 0.84 11/08/2021  1557      Component Value Date/Time   CALCIUM 9.7 07/19/2022 1350   ALKPHOS 79 07/19/2022 1350   AST 29 07/19/2022 1350   AST 25 11/08/2021 1557   ALT 29 07/19/2022 1350   ALT 32 11/08/2021 1557   BILITOT 0.7 07/19/2022 1350   BILITOT 0.2 (L) 11/08/2021 1557       RADIOGRAPHIC STUDIES:  No results found.   ASSESSMENT/PLAN:  Jimmy [name] is a 51 y.o. [name] with    1. Cancer of Sigmoid Colon, moderately differentiated adenocarcinoma, stage IIIB p(T3, N1b), MMR normal  -presented with intermittent rectal bleeding for 6 months, first colonoscopy on 08/14/21 by Dr. Michail [name] showed sigmoid colon mass, biopsy  confirmed adenocarcinoma. -baseline CEA on 09/12/21 was WNL. -CT CAP on 09/20/21 showed: wall thickening of sigmoid colon; nonspecific but suspicious sigmoid mesentery lymph nodes; two indeterminate tiny hepatic lesions; otherwise no definite metastatic disease.  -left hemicolectomy on 10/19/21 by Dr. Johney Maine showed 4 cm adenocarcinoma, grade 2 (moderately differentiated), invasive through muscularis propria into pericolonic tissue; margins negative; metastatic carcinoma in two lymph nodes (2/18).  No lymphovascular or perineural invasion. -liver MRI on 11/24/21 showed benign cysts, no evidence of liver or other metastatic disease. -he completed 4 cycles adjuvant CAPOX 11/23/21 - 01/24/22. He tolerated moderately well with fatigue, nausea, and cold sensitivity.  -Surveillant CT CAP 04/13/22 showed NED. Plan for repeat in 6 months, then annually. -he reports he has recovered completely. Lab from 12/14 reviewed, overall WNL. CEA pending.  -I will arrange for repeat CT scan of the chest in 3 months and follow up a few days later.      PLAN:  -lab and f/u with Dr. Burr Medico in 3 months -Restaging CAP CT scan prior to next appointment -CEA pending, will of course call if concerning findings.   Orders Placed This Encounter  Procedures   CT CHEST ABDOMEN PELVIS W CONTRAST    Standing Status:   Future    Standing Expiration Date:   07/20/2023    Order Specific Question:   If indicated for the ordered procedure, I authorize the administration of contrast media per Radiology protocol    Answer:   Yes    Order Specific Question:   Does the patient have a contrast media/X-ray dye allergy?    Answer:   No    Order Specific Question:   Preferred imaging location?    Answer:   [facility]    Order Specific Question:   Is Oral Contrast requested for this exam?    Answer:   Yes, Per Radiology protocol     The total time spent in the appointment was 20-29 minutes  [place],  PA-C 07/19/22

## 2022-10-17 ENCOUNTER — Inpatient Hospital Stay: Payer: 59 | Attending: Hematology

## 2022-10-17 ENCOUNTER — Inpatient Hospital Stay: Payer: 59 | Admitting: Hematology

## 2022-10-17 ENCOUNTER — Other Ambulatory Visit: Payer: Self-pay

## 2022-10-17 ENCOUNTER — Ambulatory Visit (HOSPITAL_COMMUNITY)
Admission: RE | Admit: 2022-10-17 | Discharge: 2022-10-17 | Disposition: A | Discharge status: Home / Self Care | Payer: 59 | Source: Ambulatory Visit | Attending: Physician Assistant | Admitting: Physician Assistant

## 2022-10-17 DIAGNOSIS — C7989 Secondary malignant neoplasm of other specified sites: Secondary | ICD-10-CM | POA: Diagnosis not present

## 2022-10-17 DIAGNOSIS — C787 Secondary malignant neoplasm of liver and intrahepatic bile duct: Secondary | ICD-10-CM | POA: Insufficient documentation

## 2022-10-17 DIAGNOSIS — C187 Malignant neoplasm of sigmoid colon: Secondary | ICD-10-CM | POA: Insufficient documentation

## 2022-10-17 DIAGNOSIS — C779 Secondary and unspecified malignant neoplasm of lymph node, unspecified: Secondary | ICD-10-CM | POA: Diagnosis not present

## 2022-10-17 LAB — COMPREHENSIVE METABOLIC PANEL
ALT: 24 U/L (ref 0–44)
AST: 24 U/L (ref 15–41)
Albumin: 4.6 g/dL (ref 3.5–5.0)
Alkaline Phosphatase: 69 U/L (ref 38–126)
Anion gap: 8 (ref 5–15)
BUN: 13 mg/dL (ref 6–20)
CO2: 28 mmol/L (ref 22–32)
Calcium: 9.4 mg/dL (ref 8.9–10.3)
Chloride: 102 mmol/L (ref 98–111)
Creatinine, Ser: 0.94 mg/dL (ref 0.61–1.24)
GFR, Estimated: >60 mL/min (ref >60)
Glucose, Bld: 88 mg/dL (ref 70–99)
Potassium: 3.9 mmol/L (ref 3.5–5.1)
Sodium: 138 mmol/L (ref 135–145)
Total Bilirubin: 0.8 mg/dL (ref 0.3–1.2)
Total Protein: 8 g/dL (ref 6.5–8.1)

## 2022-10-17 LAB — CBC WITH DIFFERENTIAL/PLATELET
Abs Immature Granulocytes: 0.03 10*3/uL (ref 0.00–0.07)
Basophils Absolute: 0.1 10*3/uL (ref 0.0–0.1)
Basophils Relative: 1 %
Eosinophils Absolute: 0.3 10*3/uL (ref 0.0–0.5)
Eosinophils Relative: 4 %
HCT: 45.7 % (ref 39.0–52.0)
Hemoglobin: 15.8 g/dL (ref 13.0–17.0)
Immature Granulocytes: 1 %
Lymphocytes Relative: 24 %
Lymphs Abs: 1.6 10*3/uL (ref 0.7–4.0)
MCH: 31.7 pg (ref 26.0–34.0)
MCHC: 34.6 g/dL (ref 30.0–36.0)
MCV: 91.6 fL (ref 80.0–100.0)
Monocytes Absolute: 0.9 10*3/uL (ref 0.1–1.0)
Monocytes Relative: 14 %
Neutro Abs: 3.8 10*3/uL (ref 1.7–7.7)
Neutrophils Relative %: 56 %
Platelets: 281 10*3/uL (ref 150–400)
RBC: 4.99 MIL/uL (ref 4.22–5.81)
RDW: 12.6 % (ref 11.5–15.5)
WBC: 6.6 10*3/uL (ref 4.0–10.5)
nRBC: 0 % (ref 0.0–0.2)

## 2022-10-17 LAB — CEA (IN HOUSE-CHCC): CEA (CHCC-In House): 2.41 ng/mL (ref 0.00–5.00)

## 2022-10-17 MED ORDER — IOHEXOL 9 MG/ML PO SOLN
ORAL | Status: AC
  Filled 2022-10-17: qty 1000

## 2022-10-17 MED ORDER — IOHEXOL 300 MG/ML  SOLN
100.0000 mL | Freq: Once | INTRAMUSCULAR | Status: AC | PRN
  Administered 2022-10-17: 100 mL via INTRAVENOUS

## 2022-10-17 MED ORDER — IOHEXOL 9 MG/ML PO SOLN
500.0000 mL | ORAL | Status: AC
  Administered 2022-10-17 (×2): 500 mL via ORAL

## 2022-10-18 NOTE — Progress Notes (Signed)
[place]   Telephone:([phone] [phone]) [phone]   Clinic Follow up Note   Patient Care Team: Enid Skeens., MD as PCP - General (Family Medicine) Tyjai Boston, MD as Consulting Physician (General Surgery) Wilford Corner, MD as Consulting Physician (Gastroenterology) Revankar, Reita Cliche, MD as Consulting Physician (Cardiology) Truitt Merle, MD as Consulting Physician (Oncology)  Date of Service:  10/19/2022  I connected with Jimmy [name] on 10/19/2022 at  8:00 AM EDT by telephone visit and verified that I am speaking with the correct person using two identifiers.  I discussed the limitations, risks, security and privacy concerns of performing an evaluation and management service by telephone and the availability of in person appointments. I also discussed with the patient that there may be a patient responsible charge related to this service. The patient expressed understanding and agreed to proceed.   Other persons participating in the visit and their role in the encounter:  no  Patient's location: Home Provider's location:  Office  CHIEF COMPLAINT: f/u of  Colon Cancer    CURRENT THERAPY:  Surveillance     ASSESSMENT & PLAN:  Jimmy [name] is a 52 y.o. male with    Cancer of sigmoid colon ([place]) stage IIIB p(T3, N1b), MMR normal  -presented with intermittent rectal bleeding for 6 months, first colonoscopy on 08/14/21 by Dr. Michail Sermon showed sigmoid colon mass, biopsy confirmed adenocarcinoma. -baseline CEA on 09/12/21 was WNL. -CT CAP on 09/20/21 showed: wall thickening of sigmoid colon; nonspecific but suspicious sigmoid mesentery lymph nodes; two indeterminate tiny hepatic lesions; otherwise no definite metastatic disease.  -left hemicolectomy on 10/19/21 by Dr. Johney Maine showed 4 cm adenocarcinoma, grade 2 (moderately differentiated), invasive through muscularis propria into pericolonic tissue; margins negative; metastatic carcinoma in two lymph nodes  (2/18).  No lymphovascular or perineural invasion. -liver MRI on 11/24/21 showed benign cysts, no evidence of liver or other metastatic disease. -he completed 4 cycles adjuvant CAPOX 11/23/21 - 01/24/22. He tolerated moderately well with fatigue, nausea, and cold sensitivity.  -Surveillant CT CAP 10/17/2022 showed NED. I reviewed with him.  -he is clinically doing very well, no concerns. Lab reviewed, all normal.   PLAN: -Discuss CT scan  and lab results, NED  -continue Surveillance  -lab and f/u in 4 months  SUMMARY OF ONCOLOGIC HISTORY: Oncology History  Cancer of sigmoid colon ([place])  08/14/2021 Procedure   Colonoscopy, Dr. Michail Sermon  Impression:  - Malignant partially obstructing tumor in the sigmoid colon. Biopsied. Tattooed.   08/14/2021 Initial Biopsy   INVASIVE MODERATELY DIFFERENTIATED ADENOCARCINOMA   09/20/2021 Imaging   EXAM: CT CHEST, ABDOMEN, AND PELVIS WITH CONTRAST  IMPRESSION: 1. Short segment focal wall thickening of the sigmoid colon, possibly reflecting patient's newly diagnosed colon cancer. 2. Prominent lymph nodes in the sigmoid mesentery, adjacent to the wall thickening, the largest of which measures 5 mm in short axis are nonspecific but suspicious for early nodal involvement. 3. Two tiny hypodense hepatic lesions measuring up to 6 mm, the larger of which was present on prior study dating back to September 02, 2019, but the 4 mm is new since then, and both of which are too small to accurately characterize. While these almost certainly reflect a benign etiology, given that the 4 mm lesion in the right lobe of liver is new from prior, a hepatic metastases in this patient with colon cancer can not be completely excluded. Suggest attention on follow-up imaging. 4. Otherwise, no definite evidence of distant metastatic disease in  the chest, abdomen or pelvis. 5. Diffuse hepatic steatosis. 6. Punctate nonobstructive left lower pole renal calculus. 7. Colonic  diverticulosis without findings of acute diverticulitis.   10/19/2021 Initial Diagnosis   Cancer of sigmoid ([place])   10/19/2021 Cancer Staging   Staging form: Colon and Rectum, AJCC 8th Edition - Pathologic stage from 10/19/2021: Stage IIIB (pT3, pN1b, cM0) - Signed by Truitt Merle, MD on 11/06/2021 Histologic grading system: 4 grade system Histologic grade (G): G2 Residual tumor (R): R0 - None   10/19/2021 Definitive Surgery   FINAL MICROSCOPIC DIAGNOSIS:   A.   LEFT COLON, RESECTION:  -    Adenocarcinoma, grade 2 (moderately differentiated), 4 cm in  greatest dimension, invasive through muscularis propria into pericolonic tissue.  -    All margins negative for tumor.  -    Metastatic carcinoma present in two of eighteen regional lymph  nodes (2/18).   B.   FINAL DISTAL MARGIN, EXCISION:  -    Negative for adenomatous change and malignancy.    11/23/2021 - 01/24/2022 Chemotherapy   Patient is on Treatment Plan : COLORECTAL Xelox (Capeox) q21d        INTERVAL HISTORY:  Jimmy [name] was contacted for a follow up of  Colon Cancer  . He was last seen by me on 07/20/2023. Pt report he is feeling well and denies having any pain.   All other systems were reviewed with the patient and are negative.  MEDICAL HISTORY:  Past Medical History:  Diagnosis Date   Anxiety    Cancer ([place])    GERD (gastroesophageal reflux disease)    History of kidney stones    Sleep apnea     SURGICAL HISTORY: Past Surgical History:  Procedure Laterality Date   EXTRACORPOREAL SHOCK WAVE LITHOTRIPSY Right 09/17/2019   Procedure: EXTRACORPOREAL SHOCK WAVE LITHOTRIPSY (ESWL);  Surgeon: Kathie Rhodes, MD;  Location: [facility];  Service: Urology;  Laterality: Right;   PROCTOSCOPY N/A 10/19/2021   Procedure: RIGIDI PROCTOSCOPY;  Surgeon: Bertel Boston, MD;  Location: [facility];  Service: General;  Laterality: N/A;   WISDOM TOOTH EXTRACTION      I have reviewed the social history and family  history with the patient and they are unchanged from previous note.  ALLERGIES:  has No Known Allergies.  MEDICATIONS:  Current Outpatient Medications  Medication Sig Dispense Refill   amitriptyline (ELAVIL) 50 MG tablet Take 50 mg by mouth at bedtime.     sertraline (ZOLOFT) 100 MG tablet Take 100 mg by mouth every evening.     testosterone cypionate (DEPOTESTOSTERONE CYPIONATE) 200 MG/ML injection Inject 0.5 mLs into the skin every 14 (fourteen) days.     No current facility-administered medications for this visit.    PHYSICAL EXAMINATION: ECOG PERFORMANCE STATUS: 0 - Asymptomatic  There were no vitals filed for this visit. Wt Readings from Last 3 Encounters:  07/19/22 239 lb 14.4 oz (108.8 kg)  04/20/22 235 lb 6.4 oz (106.8 kg)  01/24/22 235 lb (106.6 kg)     No vitals taken today, Exam not performed today  LABORATORY DATA:  I have reviewed the data as listed    Latest Ref Rng & Units 10/17/2022    1:31 PM 07/19/2022    1:50 PM 04/13/2022    1:24 PM  CBC  WBC 4.0 - 10.5 K/uL 6.6  5.9  6.5   Hemoglobin 13.0 - 17.0 g/dL 15.8  14.7  14.9   Hematocrit 39.0 - 52.0 % 45.7  44.0  44.0   Platelets 150 - 400 K/uL 281  232  247         Latest Ref Rng & Units 10/17/2022    1:31 PM 07/19/2022    1:50 PM 04/13/2022    1:24 PM  CMP  Glucose 70 - 99 mg/dL 88  101  89   BUN 6 - 20 mg/dL 13  7  15    Creatinine 0.61 - 1.24 mg/dL 0.94  0.96  0.87   Sodium 135 - 145 mmol/L 138  141  136   Potassium 3.5 - 5.1 mmol/L 3.9  4.1  4.0   Chloride 98 - 111 mmol/L 102  104  104   CO2 22 - 32 mmol/L 28  31  28    Calcium 8.9 - 10.3 mg/dL 9.4  9.7  9.4   Total Protein 6.5 - 8.1 g/dL 8.0  7.2  8.0   Total Bilirubin 0.3 - 1.2 mg/dL 0.8  0.7  0.7   Alkaline Phos 38 - 126 U/L 69  79  76   AST 15 - 41 U/L 24  29  23    ALT 0 - 44 U/L 24  29  21        RADIOGRAPHIC STUDIES: I have personally reviewed the radiological images as listed and agreed with the findings in the report. CT CHEST ABDOMEN  PELVIS W CONTRAST  Result Date: 10/19/2022 CLINICAL DATA:  A 52 year old male presents for evaluation of colon cancer to assess treatment response. * Tracking Code: BO * EXAM: CT CHEST, ABDOMEN, AND PELVIS WITH CONTRAST TECHNIQUE: Multidetector CT imaging of the chest, abdomen and pelvis was performed following the standard protocol during bolus administration of intravenous contrast. RADIATION DOSE REDUCTION: This exam was performed according to the departmental dose-optimization program which includes automated exposure control, adjustment of the mA and/or kV according to patient size and/or use of iterative reconstruction technique. CONTRAST:  177mL OMNIPAQUE IOHEXOL 300 MG/ML  SOLN COMPARISON:  April 13, 2022 FINDINGS: CT CHEST FINDINGS Cardiovascular: Thoracic aorta is normal caliber. Heart size is normal. Central pulmonary vasculature is unremarkable to the extent evaluated on venous phase. Mediastinum/Nodes: No thoracic inlet, axillary, mediastinal or hilar adenopathy. Esophagus grossly normal. Lungs/Pleura: Accessory fissure, azygous fissure in the RIGHT upper lobe. Basilar atelectasis. Airways are patent. Lungs are otherwise clear. No pleural effusion. Musculoskeletal: See below for full musculoskeletal details. No chest wall mass. CT ABDOMEN PELVIS FINDINGS Hepatobiliary: No focal, suspicious hepatic lesion. Stable small hepatic cysts better defined on prior MR imaging of the abdomen. No pericholecystic stranding. No biliary duct dilation. Portal vein is patent. There is mild fissural widening and there is mild hepatic steatosis. There is replaced RIGHT hepatic artery that arises from the SMA. Pancreas: Mild atrophy of the pancreas without ductal dilation, inflammation or visible lesion. Spleen: Normal. Adrenals/Urinary Tract: Adrenal glands are unremarkable. Symmetric renal enhancement. No sign of hydronephrosis. No suspicious renal lesion or perinephric stranding. Stable Bosniak category I and II  cysts of the RIGHT kidney which require no dedicated imaging follow-up. Urinary bladder is grossly unremarkable. Stomach/Bowel: Post LEFT hemicolectomy. No soft tissue thickening in the area of the anastomosis. No acute gastrointestinal process. Normal appendix. Vascular/Lymphatic: Aorta with smooth contours. IVC with smooth contours. No aneurysmal dilation of the abdominal aorta. There is no gastrohepatic or hepatoduodenal ligament lymphadenopathy. No retroperitoneal or mesenteric lymphadenopathy. No pelvic sidewall lymphadenopathy. Reproductive: Normal to the extent evaluated by CT. Other: No ascites Musculoskeletal: Degenerative changes of the spine. Mild levoconvex changes of the lumbar  spine related to degenerative process similar to prior imaging. No acute or destructive bone process. IMPRESSION: 1. Post LEFT hemicolectomy. No evidence of recurrent or metastatic disease. 2. Mild hepatic steatosis and fissural widening. Correlate with any clinical or laboratory evidence of liver disease. Electronically Signed   By: Zetta Bills M.D.   On: 10/19/2022 08:13      No orders of the defined types were placed in this encounter.  All questions were answered. The patient knows to call the clinic with any problems, questions or concerns. No barriers to learning was detected. The total time spent in the appointment was 15 minutes.     Truitt Merle, MD 10/19/2022   Felicity Coyer am acting as scribe for Truitt Merle, MD.   I have reviewed the above documentation for accuracy and completeness, and I agree with the above.

## 2022-10-19 ENCOUNTER — Encounter: Payer: Self-pay | Admitting: Hematology

## 2022-10-19 ENCOUNTER — Inpatient Hospital Stay (HOSPITAL_BASED_OUTPATIENT_CLINIC_OR_DEPARTMENT_OTHER): Payer: 59 | Admitting: Hematology

## 2022-10-19 ENCOUNTER — Telehealth: Payer: Self-pay | Admitting: Hematology

## 2022-10-19 DIAGNOSIS — C187 Malignant neoplasm of sigmoid colon: Secondary | ICD-10-CM

## 2022-10-19 NOTE — Telephone Encounter (Signed)
Per 3/15 LOS reached out to patient to schedule, patent aware of date and time of appointment.

## 2022-10-19 NOTE — Assessment & Plan Note (Signed)
stage IIIB p(T3, N1b), MMR normal  -presented with intermittent rectal bleeding for 6 months, first colonoscopy on 08/14/21 by Dr. Michail Sermon showed sigmoid colon mass, biopsy confirmed adenocarcinoma. -baseline CEA on 09/12/21 was WNL. -CT CAP on 09/20/21 showed: wall thickening of sigmoid colon; nonspecific but suspicious sigmoid mesentery lymph nodes; two indeterminate tiny hepatic lesions; otherwise no definite metastatic disease.  -left hemicolectomy on 10/19/21 by Dr. Johney Maine showed 4 cm adenocarcinoma, grade 2 (moderately differentiated), invasive through muscularis propria into pericolonic tissue; margins negative; metastatic carcinoma in two lymph nodes (2/18).  No lymphovascular or perineural invasion. -liver MRI on 11/24/21 showed benign cysts, no evidence of liver or other metastatic disease. -he completed 4 cycles adjuvant CAPOX 11/23/21 - 01/24/22. He tolerated moderately well with fatigue, nausea, and cold sensitivity.  -Surveillant CT CAP 10/17/2022 showed NED. I reviewed with him.

## 2023-02-28 ENCOUNTER — Other Ambulatory Visit: Payer: Self-pay

## 2023-02-28 DIAGNOSIS — C187 Malignant neoplasm of sigmoid colon: Secondary | ICD-10-CM

## 2023-02-28 NOTE — Assessment & Plan Note (Signed)
stage IIIB p(T3, N1b), MMR normal  -presented with intermittent rectal bleeding for 6 months, first colonoscopy on 08/14/21 by Dr. Bosie Clos showed sigmoid colon mass, biopsy confirmed adenocarcinoma. -baseline CEA on 09/12/21 was WNL. -CT CAP on 09/20/21 showed: wall thickening of sigmoid colon; nonspecific but suspicious sigmoid mesentery lymph nodes; two indeterminate tiny hepatic lesions; otherwise no definite metastatic disease.  -left hemicolectomy on 10/19/21 by Dr. Michaell Cowing showed 4 cm adenocarcinoma, grade 2 (moderately differentiated), invasive through muscularis propria into pericolonic tissue; margins negative; metastatic carcinoma in two lymph nodes (2/18).  No lymphovascular or perineural invasion. -liver MRI on 11/24/21 showed benign cysts, no evidence of liver or other metastatic disease. -he completed 4 cycles adjuvant CAPOX 11/23/21 - 01/24/22. He tolerated moderately well with fatigue, nausea, and cold sensitivity.  -Surveillant CT CAP 10/17/2022 showed NED.  -He is clinically doing well, lab reviewed, exam was unremarkable, there is no clinical concern for recurrence. -Follow-up in 3 months with lab and surveillance CT scan.

## 2023-03-01 ENCOUNTER — Inpatient Hospital Stay: Payer: 59 | Attending: Hematology

## 2023-03-01 ENCOUNTER — Inpatient Hospital Stay: Payer: 59 | Admitting: Hematology

## 2023-03-01 ENCOUNTER — Other Ambulatory Visit: Payer: Self-pay

## 2023-03-01 VITALS — BP 132/94 | HR 62 | Temp 97.8°F | Ht 74.0 in | Wt 242.0 lb

## 2023-03-01 DIAGNOSIS — C787 Secondary malignant neoplasm of liver and intrahepatic bile duct: Secondary | ICD-10-CM | POA: Diagnosis not present

## 2023-03-01 DIAGNOSIS — C187 Malignant neoplasm of sigmoid colon: Secondary | ICD-10-CM | POA: Diagnosis not present

## 2023-03-01 LAB — CBC WITH DIFFERENTIAL (CANCER CENTER ONLY)
Abs Immature Granulocytes: 0.01 10*3/uL (ref 0.00–0.07)
Basophils Absolute: 0.1 10*3/uL (ref 0.0–0.1)
Basophils Relative: 1 %
Eosinophils Absolute: 0.3 10*3/uL (ref 0.0–0.5)
Eosinophils Relative: 6 %
HCT: 46.1 % (ref 39.0–52.0)
Hemoglobin: 15.4 g/dL (ref 13.0–17.0)
Immature Granulocytes: 0 %
Lymphocytes Relative: 24 %
Lymphs Abs: 1.1 10*3/uL (ref 0.7–4.0)
MCH: 31.2 pg (ref 26.0–34.0)
MCHC: 33.4 g/dL (ref 30.0–36.0)
MCV: 93.5 fL (ref 80.0–100.0)
Monocytes Absolute: 0.6 10*3/uL (ref 0.1–1.0)
Monocytes Relative: 13 %
Neutro Abs: 2.5 10*3/uL (ref 1.7–7.7)
Neutrophils Relative %: 56 %
Platelet Count: 233 10*3/uL (ref 150–400)
RBC: 4.93 MIL/uL (ref 4.22–5.81)
RDW: 12.9 % (ref 11.5–15.5)
WBC Count: 4.5 10*3/uL (ref 4.0–10.5)
nRBC: 0 % (ref 0.0–0.2)

## 2023-03-01 LAB — CMP (CANCER CENTER ONLY)
ALT: 17 U/L (ref 0–44)
AST: 19 U/L (ref 15–41)
Albumin: 4.2 g/dL (ref 3.5–5.0)
Alkaline Phosphatase: 80 U/L (ref 38–126)
Anion gap: 5 (ref 5–15)
BUN: 11 mg/dL (ref 6–20)
CO2: 29 mmol/L (ref 22–32)
Calcium: 9.2 mg/dL (ref 8.9–10.3)
Chloride: 106 mmol/L (ref 98–111)
Creatinine: 0.94 mg/dL (ref 0.61–1.24)
GFR, Estimated: >60 mL/min (ref >60)
Glucose, Bld: 92 mg/dL (ref 70–99)
Potassium: 4.3 mmol/L (ref 3.5–5.1)
Sodium: 140 mmol/L (ref 135–145)
Total Bilirubin: 0.6 mg/dL (ref 0.3–1.2)
Total Protein: 6.8 g/dL (ref 6.5–8.1)

## 2023-03-01 NOTE — Progress Notes (Signed)
[facility]   Telephone:(336) [phone] Fax:(336) [phone]   Clinic Follow up Note   Patient Care Team: Nonnie Done., MD as PCP - General (Family Medicine) Karie Soda, MD as Consulting Physician (General Surgery) Charlott Rakes, MD as Consulting Physician (Gastroenterology) Revankar, Aundra Dubin, MD as Consulting Physician (Cardiology) Malachy Mood, MD as Consulting Physician (Oncology)  Date of Service:  03/01/2023  CHIEF COMPLAINT: f/u of  Colon Cancer     CURRENT THERAPY:  Surveillance       ASSESSMENT:  Jimmy [name] is a 52 y.o. male with   Cancer of sigmoid colon (HCC) stage IIIB p(T3, N1b), MMR normal  -presented with intermittent rectal bleeding for 6 months, first colonoscopy on 08/14/21 by Dr. Bosie Clos showed sigmoid colon mass, biopsy confirmed adenocarcinoma. -baseline CEA on 09/12/21 was WNL. -CT CAP on 09/20/21 showed: wall thickening of sigmoid colon; nonspecific but suspicious sigmoid mesentery lymph nodes; two indeterminate tiny hepatic lesions; otherwise no definite metastatic disease.  -left hemicolectomy on 10/19/21 by Dr. Michaell Cowing showed 4 cm adenocarcinoma, grade 2 (moderately differentiated), invasive through muscularis propria into pericolonic tissue; margins negative; metastatic carcinoma in two lymph nodes (2/18).  No lymphovascular or perineural invasion. -liver MRI on 11/24/21 showed benign cysts, no evidence of liver or other metastatic disease. -he completed 4 cycles adjuvant CAPOX 11/23/21 - 01/24/22. He tolerated moderately well with fatigue, nausea, and cold sensitivity.  -Surveillant CT CAP 10/17/2022 showed NED.  -He is clinically doing well, lab reviewed, exam was unremarkable, there is no clinical concern for recurrence. -Follow-up in 3 months with lab  -next CT in 6 months     PLAN: - f/u and labs in 3 months with Cassie    SUMMARY OF ONCOLOGIC HISTORY: Oncology History  Cancer of sigmoid colon (HCC)  08/14/2021 Procedure    Colonoscopy, Dr. Bosie Clos  Impression:  - Malignant partially obstructing tumor in the sigmoid colon. Biopsied. Tattooed.   08/14/2021 Initial Biopsy   INVASIVE MODERATELY DIFFERENTIATED ADENOCARCINOMA   09/20/2021 Imaging   EXAM: CT CHEST, ABDOMEN, AND PELVIS WITH CONTRAST  IMPRESSION: 1. Short segment focal wall thickening of the sigmoid colon, possibly reflecting patient's newly diagnosed colon cancer. 2. Prominent lymph nodes in the sigmoid mesentery, adjacent to the wall thickening, the largest of which measures 5 mm in short axis are nonspecific but suspicious for early nodal involvement. 3. Two tiny hypodense hepatic lesions measuring up to 6 mm, the larger of which was present on prior study dating back to September 02, 2019, but the 4 mm is new since then, and both of which are too small to accurately characterize. While these almost certainly reflect a benign etiology, given that the 4 mm lesion in the right lobe of liver is new from prior, a hepatic metastases in this patient with colon cancer can not be completely excluded. Suggest attention on follow-up imaging. 4. Otherwise, no definite evidence of distant metastatic disease in the chest, abdomen or pelvis. 5. Diffuse hepatic steatosis. 6. Punctate nonobstructive left lower pole renal calculus. 7. Colonic diverticulosis without findings of acute diverticulitis.   10/19/2021 Initial Diagnosis   Cancer of sigmoid (HCC)   10/19/2021 Cancer Staging   Staging form: Colon and Rectum, AJCC 8th Edition - Pathologic stage from 10/19/2021: Stage IIIB (pT3, pN1b, cM0) - Signed by Malachy Mood, MD on 11/06/2021 Histologic grading system: 4 grade system Histologic grade (G): G2 Residual tumor (R): R0 - None   10/19/2021 Definitive Surgery   FINAL MICROSCOPIC DIAGNOSIS:   A.  LEFT COLON, RESECTION:  -    Adenocarcinoma, grade 2 (moderately differentiated), 4 cm in  greatest dimension, invasive through muscularis propria into  pericolonic tissue.  -    All margins negative for tumor.  -    Metastatic carcinoma present in two of eighteen regional lymph  nodes (2/18).   B.   FINAL DISTAL MARGIN, EXCISION:  -    Negative for adenomatous change and malignancy.    11/23/2021 - 01/24/2022 Chemotherapy   Patient is on Treatment Plan : COLORECTAL Xelox (Capeox) q21d        INTERVAL HISTORY:  Jimmy [name] was contacted for a follow up of  Colon Cancer  . He was last seen by me on 10/19/2022. Patient presents to clinic alone. Pt report he is feeling well and denies having any pain.    All other systems were reviewed with the patient and are negative.  MEDICAL HISTORY:  Past Medical History:  Diagnosis Date   Anxiety    Cancer (HCC)    GERD (gastroesophageal reflux disease)    History of kidney stones    Sleep apnea     SURGICAL HISTORY: Past Surgical History:  Procedure Laterality Date   EXTRACORPOREAL SHOCK WAVE LITHOTRIPSY Right 09/17/2019   Procedure: EXTRACORPOREAL SHOCK WAVE LITHOTRIPSY (ESWL);  Surgeon: Ihor Gully, MD;  Location: [facility];  Service: Urology;  Laterality: Right;   PROCTOSCOPY N/A 10/19/2021   Procedure: RIGIDI PROCTOSCOPY;  Surgeon: Karie Soda, MD;  Location: [facility];  Service: General;  Laterality: N/A;   WISDOM TOOTH EXTRACTION      I have reviewed the social history and family history with the patient and they are unchanged from previous note.  ALLERGIES:  has No Known Allergies.  MEDICATIONS:  Current Outpatient Medications  Medication Sig Dispense Refill   amitriptyline (ELAVIL) 50 MG tablet Take 50 mg by mouth at bedtime.     sertraline (ZOLOFT) 100 MG tablet Take 100 mg by mouth every evening.     testosterone cypionate (DEPOTESTOSTERONE CYPIONATE) 200 MG/ML injection Inject 0.5 mLs into the skin every 14 (fourteen) days.     No current facility-administered medications for this visit.    PHYSICAL EXAMINATION: ECOG PERFORMANCE STATUS: 0 -  Asymptomatic  Vitals:   03/01/23 0815  BP: (!) 132/94  Pulse: 62  Temp: 97.8 F (36.6 C)  SpO2: 100%   Wt Readings from Last 3 Encounters:  03/01/23 242 lb (109.8 kg)  07/19/22 239 lb 14.4 oz (108.8 kg)  04/20/22 235 lb 6.4 oz (106.8 kg)    GENERAL:alert, no distress and comfortable SKIN: skin color, texture, turgor are normal, no rashes or significant lesions EYES: normal, Conjunctiva are pink and non-injected, sclera clear NECK: supple, thyroid normal size, non-tender, without nodularity LYMPH:  no palpable lymphadenopathy in the cervical, axillary  LUNGS: clear to auscultation and percussion with normal breathing effort HEART: regular rate & rhythm and no murmurs and no lower extremity edema ABDOMEN: abdomen soft, non-tender and normal bowel sounds Musculoskeletal:no cyanosis of digits and no clubbing  NEURO: alert & oriented x 3 with fluent speech, no focal motor/sensory deficits  LABORATORY DATA:  I have reviewed the data as listed    Latest Ref Rng & Units 03/01/2023    8:01 AM 10/17/2022    1:31 PM 07/19/2022    1:50 PM  CBC  WBC 4.0 - 10.5 K/uL 4.5  6.6  5.9   Hemoglobin 13.0 - 17.0 g/dL 86.5  78.4  14.7  Hematocrit 39.0 - 52.0 % 46.1  45.7  44.0   Platelets 150 - 400 K/uL 233  281  232         Latest Ref Rng & Units 03/01/2023    8:01 AM 10/17/2022    1:31 PM 07/19/2022    1:50 PM  CMP  Glucose 70 - 99 mg/dL 92  88  657   BUN 6 - 20 mg/dL 11  13  7    Creatinine 0.61 - 1.24 mg/dL 8.46  9.62  9.52   Sodium 135 - 145 mmol/L 140  138  141   Potassium 3.5 - 5.1 mmol/L 4.3  3.9  4.1   Chloride 98 - 111 mmol/L 106  102  104   CO2 22 - 32 mmol/L 29  28  31    Calcium 8.9 - 10.3 mg/dL 9.2  9.4  9.7   Total Protein 6.5 - 8.1 g/dL 6.8  8.0  7.2   Total Bilirubin 0.3 - 1.2 mg/dL 0.6  0.8  0.7   Alkaline Phos 38 - 126 U/L 80  69  79   AST 15 - 41 U/L 19  24  29    ALT 0 - 44 U/L 17  24  29        RADIOGRAPHIC STUDIES: I have personally reviewed the radiological  images as listed and agreed with the findings in the report. No results found.    No orders of the defined types were placed in this encounter.  All questions were answered. The patient knows to call the clinic with any problems, questions or concerns. No barriers to learning was detected. The total time spent in the appointment was 20 minutes.     Malachy Mood, MD 03/01/2023   I, Sharlette Dense, CMA, am acting as scribe for Malachy Mood, MD.   I have reviewed the above documentation for accuracy and completeness, and I agree with the above.

## 2023-05-29 NOTE — Progress Notes (Deleted)
Patient Care Team: Nonnie Done., MD as PCP - General (Family Medicine) Karie Soda, MD as Consulting Physician (General Surgery) Charlott Rakes, MD as Consulting Physician (Gastroenterology) Revankar, Aundra Dubin, MD as Consulting Physician (Cardiology) Malachy Mood, MD as Consulting Physician (Oncology)  Clinic Day:  05/29/2023  Referring physician: Nonnie Done., MD  ASSESSMENT & PLAN:   Assessment & Plan: Cancer of sigmoid colon (HCC) stage IIIB p(T3, N1b), MMR normal  -presented with intermittent rectal bleeding for 6 months, first colonoscopy on 08/14/21 by Dr. Bosie Clos showed sigmoid colon mass, biopsy confirmed adenocarcinoma. -baseline CEA on 09/12/21 was WNL. -CT CAP on 09/20/21 showed: wall thickening of sigmoid colon; nonspecific but suspicious sigmoid mesentery lymph nodes; two indeterminate tiny hepatic lesions; otherwise no definite metastatic disease.  -left hemicolectomy on 10/19/21 by Dr. Michaell Cowing showed 4 cm adenocarcinoma, grade 2 (moderately differentiated), invasive through muscularis propria into pericolonic tissue; margins negative; metastatic carcinoma in two lymph nodes (2/18).  No lymphovascular or perineural invasion. -liver MRI on 11/24/21 showed benign cysts, no evidence of liver or other metastatic disease. -he completed 4 cycles adjuvant CAPOX 11/23/21 - 01/24/22. He tolerated moderately well with fatigue, nausea, and cold sensitivity.  -Surveillant CT CAP 10/17/2022 showed NED.  -He is clinically doing well, lab reviewed, exam was unremarkable, there is no clinical concern for recurrence. -Follow-up in 3 months with lab and surveillance CT scan.   The patient understands the plans discussed today and is in agreement with them.  He knows to contact our office if he develops concerns prior to his next appointment.  I provided *** minutes of face-to-face time during this encounter and > 50% was spent counseling as documented under my assessment and plan.     Carlean Jews, NP  [facility] [address] [place] [address] Dept: [phone] Dept Fax: [phone]   No orders of the defined types were placed in this encounter.     CHIEF COMPLAINT:  CC: cancer of sigmoid colon  Current Treatment:  surveillance   INTERVAL HISTORY:  Jimmy [name] is here today for repeat clinical assessment. Last saw Dr. Mosetta Putt 03/01/2023. He has been doing well in general.  He denies fevers or chills. He denies pain. His appetite is good. His weight {Weight change:10426}.  I have reviewed the past medical history, past surgical history, social history and family history with the patient and they are unchanged from previous note.  ALLERGIES:  has No Known Allergies.  MEDICATIONS:  Current Outpatient Medications  Medication Sig Dispense Refill   amitriptyline (ELAVIL) 50 MG tablet Take 50 mg by mouth at bedtime.     sertraline (ZOLOFT) 100 MG tablet Take 100 mg by mouth every evening.     testosterone cypionate (DEPOTESTOSTERONE CYPIONATE) 200 MG/ML injection Inject 0.5 mLs into the skin every 14 (fourteen) days.     No current facility-administered medications for this visit.    HISTORY OF PRESENT ILLNESS:   Oncology History  Cancer of sigmoid colon (HCC)  08/14/2021 Procedure   Colonoscopy, Dr. Bosie Clos  Impression:  - Malignant partially obstructing tumor in the sigmoid colon. Biopsied. Tattooed.   08/14/2021 Initial Biopsy   INVASIVE MODERATELY DIFFERENTIATED ADENOCARCINOMA   09/20/2021 Imaging   EXAM: CT CHEST, ABDOMEN, AND PELVIS WITH CONTRAST  IMPRESSION: 1. Short segment focal wall thickening of the sigmoid colon, possibly reflecting patient's newly diagnosed colon cancer. 2. Prominent lymph nodes in the sigmoid mesentery, adjacent to the wall thickening,  the largest of which measures 5 mm in short axis are nonspecific but suspicious for early nodal  involvement. 3. Two tiny hypodense hepatic lesions measuring up to 6 mm, the larger of which was present on prior study dating back to September 02, 2019, but the 4 mm is new since then, and both of which are too small to accurately characterize. While these almost certainly reflect a benign etiology, given that the 4 mm lesion in the right lobe of liver is new from prior, a hepatic metastases in this patient with colon cancer can not be completely excluded. Suggest attention on follow-up imaging. 4. Otherwise, no definite evidence of distant metastatic disease in the chest, abdomen or pelvis. 5. Diffuse hepatic steatosis. 6. Punctate nonobstructive left lower pole renal calculus. 7. Colonic diverticulosis without findings of acute diverticulitis.   10/19/2021 Initial Diagnosis   Cancer of sigmoid (HCC)   10/19/2021 Cancer Staging   Staging form: Colon and Rectum, AJCC 8th Edition - Pathologic stage from 10/19/2021: Stage IIIB (pT3, pN1b, cM0) - Signed by Malachy Mood, MD on 11/06/2021 Histologic grading system: 4 grade system Histologic grade (G): G2 Residual tumor (R): R0 - None   10/19/2021 Definitive Surgery   FINAL MICROSCOPIC DIAGNOSIS:   A.   LEFT COLON, RESECTION:  -    Adenocarcinoma, grade 2 (moderately differentiated), 4 cm in  greatest dimension, invasive through muscularis propria into pericolonic tissue.  -    All margins negative for tumor.  -    Metastatic carcinoma present in two of eighteen regional lymph  nodes (2/18).   B.   FINAL DISTAL MARGIN, EXCISION:  -    Negative for adenomatous change and malignancy.    11/23/2021 - 01/24/2022 Chemotherapy   Patient is on Treatment Plan : COLORECTAL Xelox (Capeox) q21d         REVIEW OF SYSTEMS:   Constitutional: Denies fevers, chills or abnormal weight loss Eyes: Denies blurriness of vision Ears, nose, mouth, throat, and face: Denies mucositis or sore throat Respiratory: Denies cough, dyspnea or wheezes Cardiovascular:  Denies palpitation, chest discomfort or lower extremity swelling Gastrointestinal:  Denies nausea, heartburn or change in bowel habits Skin: Denies abnormal skin rashes Lymphatics: Denies new lymphadenopathy or easy bruising Neurological:Denies numbness, tingling or new weaknesses Behavioral/Psych: Mood is stable, no new changes  All other systems were reviewed with the patient and are negative.   VITALS:  There were no vitals taken for this visit.  Wt Readings from Last 3 Encounters:  03/01/23 242 lb (109.8 kg)  07/19/22 239 lb 14.4 oz (108.8 kg)  04/20/22 235 lb 6.4 oz (106.8 kg)    There is no height or weight on file to calculate BMI.  Performance status (ECOG): {CHL ONC [id number]  PHYSICAL EXAM:   GENERAL:alert, no distress and comfortable SKIN: skin color, texture, turgor are normal, no rashes or significant lesions EYES: normal, Conjunctiva are pink and non-injected, sclera clear OROPHARYNX:no exudate, no erythema and lips, buccal mucosa, and tongue normal  NECK: supple, thyroid normal size, non-tender, without nodularity LYMPH:  no palpable lymphadenopathy in the cervical, axillary or inguinal LUNGS: clear to auscultation and percussion with normal breathing effort HEART: regular rate & rhythm and no murmurs and no lower extremity edema ABDOMEN:abdomen soft, non-tender and normal bowel sounds Musculoskeletal:no cyanosis of digits and no clubbing  NEURO: alert & oriented x 3 with fluent speech, no focal motor/sensory deficits  LABORATORY DATA:  I have reviewed the data as listed    Component Value Date/Time  NA 140 03/01/2023 0801   K 4.3 03/01/2023 0801   CL 106 03/01/2023 0801   CO2 29 03/01/2023 0801   GLUCOSE 92 03/01/2023 0801   BUN 11 03/01/2023 0801   CREATININE 0.94 03/01/2023 0801   CALCIUM 9.2 03/01/2023 0801   PROT 6.8 03/01/2023 0801   ALBUMIN 4.2 03/01/2023 0801   AST 19 03/01/2023 0801   ALT 17 03/01/2023 0801   ALKPHOS 80 03/01/2023 0801    BILITOT 0.6 03/01/2023 0801   GFRNONAA >60 03/01/2023 0801   GFRAA >60 10/05/2018 1714    No results found for: "SPEP", "UPEP"  Lab Results  Component Value Date   WBC 4.5 03/01/2023   NEUTROABS 2.5 03/01/2023   HGB 15.4 03/01/2023   HCT 46.1 03/01/2023   MCV 93.5 03/01/2023   PLT 233 03/01/2023      Chemistry      Component Value Date/Time   NA 140 03/01/2023 0801   K 4.3 03/01/2023 0801   CL 106 03/01/2023 0801   CO2 29 03/01/2023 0801   BUN 11 03/01/2023 0801   CREATININE 0.94 03/01/2023 0801      Component Value Date/Time   CALCIUM 9.2 03/01/2023 0801   ALKPHOS 80 03/01/2023 0801   AST 19 03/01/2023 0801   ALT 17 03/01/2023 0801   BILITOT 0.6 03/01/2023 0801       RADIOGRAPHIC STUDIES: I have personally reviewed the radiological images as listed and agreed with the findings in the report. No results found.

## 2023-05-29 NOTE — Assessment & Plan Note (Deleted)
stage IIIB p(T3, N1b), MMR normal  -presented with intermittent rectal bleeding for 6 months, first colonoscopy on 08/14/21 by Dr. Bosie Clos showed sigmoid colon mass, biopsy confirmed adenocarcinoma. -baseline CEA on 09/12/21 was WNL. -CT CAP on 09/20/21 showed: wall thickening of sigmoid colon; nonspecific but suspicious sigmoid mesentery lymph nodes; two indeterminate tiny hepatic lesions; otherwise no definite metastatic disease.  -left hemicolectomy on 10/19/21 by Dr. Michaell Cowing showed 4 cm adenocarcinoma, grade 2 (moderately differentiated), invasive through muscularis propria into pericolonic tissue; margins negative; metastatic carcinoma in two lymph nodes (2/18).  No lymphovascular or perineural invasion. -liver MRI on 11/24/21 showed benign cysts, no evidence of liver or other metastatic disease. -he completed 4 cycles adjuvant CAPOX 11/23/21 - 01/24/22. He tolerated moderately well with fatigue, nausea, and cold sensitivity.  -Surveillant CT CAP 10/17/2022 showed NED.  -He is clinically doing well, lab reviewed, exam was unremarkable, there is no clinical concern for recurrence. -Follow-up in 3 months with lab  -overdue for CT CAP for surveillance

## 2023-05-30 ENCOUNTER — Telehealth: Payer: Self-pay

## 2023-05-30 ENCOUNTER — Inpatient Hospital Stay: Payer: 59 | Attending: Hematology

## 2023-05-30 ENCOUNTER — Inpatient Hospital Stay: Payer: 59 | Admitting: Nurse Practitioner

## 2023-05-30 DIAGNOSIS — C187 Malignant neoplasm of sigmoid colon: Secondary | ICD-10-CM

## 2023-05-30 NOTE — Telephone Encounter (Signed)
LVM to inform pt that this RN sent a message and a scheduler will be reaching out to him to reschedule today's lab and appt with Heater, NP.

## 2023-05-31 ENCOUNTER — Telehealth: Payer: Self-pay | Admitting: Nurse Practitioner

## 2023-06-20 ENCOUNTER — Ambulatory Visit: Payer: 59 | Admitting: Nurse Practitioner

## 2023-06-20 ENCOUNTER — Other Ambulatory Visit: Payer: 59

## 2023-07-11 NOTE — Assessment & Plan Note (Addendum)
stage IIIB p(T3, N1b), MMR normal  -presented with intermittent rectal bleeding for 6 months, first colonoscopy on 08/14/21 by Dr. Bosie Clos showed sigmoid colon mass, biopsy confirmed adenocarcinoma. -baseline CEA on 09/12/21 was WNL. -CT CAP on 09/20/21 showed: wall thickening of sigmoid colon; nonspecific but suspicious sigmoid mesentery lymph nodes; two indeterminate tiny hepatic lesions; otherwise no definite metastatic disease.  -left hemicolectomy on 10/19/21 by Dr. Michaell Cowing showed 4 cm adenocarcinoma, grade 2 (moderately differentiated), invasive through muscularis propria into pericolonic tissue; margins negative; metastatic carcinoma in two lymph nodes (2/18).  No lymphovascular or perineural invasion. -liver MRI on 11/24/21 showed benign cysts, no evidence of liver or other metastatic disease. -he completed 4 cycles adjuvant CAPOX 11/23/21 - 01/24/22. He tolerated moderately well with fatigue, nausea, and cold sensitivity.  -Surveillant CT CAP 10/17/2022 showed NED.  -He is clinically doing well, lab reviewed, exam was unremarkable, there is no clinical concern for recurrence. -New CT CAP to be scheduled in January 2025.  Will review with patient via MyChart and/or telephone call. -Labs with follow-up to be scheduled in 6 months.

## 2023-07-11 NOTE — Progress Notes (Unsigned)
Patient Care Team: Nonnie Done., MD as PCP - General (Family Medicine) Karie Soda, MD as Consulting Physician (General Surgery) Charlott Rakes, MD as Consulting Physician (Gastroenterology) Revankar, Aundra Dubin, MD as Consulting Physician (Cardiology) Malachy Mood, MD as Consulting Physician (Oncology)  Clinic Day:  07/14/2023  Referring physician: Nonnie Done., MD  ASSESSMENT & PLAN:   Assessment & Plan: Cancer of sigmoid colon (HCC) stage IIIB p(T3, N1b), MMR normal  -presented with intermittent rectal bleeding for 6 months, first colonoscopy on 08/14/21 by Dr. Bosie Clos showed sigmoid colon mass, biopsy confirmed adenocarcinoma. -baseline CEA on 09/12/21 was WNL. -CT CAP on 09/20/21 showed: wall thickening of sigmoid colon; nonspecific but suspicious sigmoid mesentery lymph nodes; two indeterminate tiny hepatic lesions; otherwise no definite metastatic disease.  -left hemicolectomy on 10/19/21 by Dr. Michaell Cowing showed 4 cm adenocarcinoma, grade 2 (moderately differentiated), invasive through muscularis propria into pericolonic tissue; margins negative; metastatic carcinoma in two lymph nodes (2/18).  No lymphovascular or perineural invasion. -liver MRI on 11/24/21 showed benign cysts, no evidence of liver or other metastatic disease. -he completed 4 cycles adjuvant CAPOX 11/23/21 - 01/24/22. He tolerated moderately well with fatigue, nausea, and cold sensitivity.  -Surveillant CT CAP 10/17/2022 showed NED.  -He is clinically doing well, lab reviewed, exam was unremarkable, there is no clinical concern for recurrence. -Follow-up in 3 months with lab and surveillance CT scan.   Plan: Labs reviewed  -CBC showing WBC 4.4; Hgb 15.2; Hct 46.9; Plt 250; Anc 2.6 -CMP - K 4.1; glucose 84; BUN 16; Creatinine 0.92; eGFR > 60; Ca 9.3; LFTs normal.   CT chest abdomen and pelvis with contrast ordered today, to be done early January 2025. Will follow-up with patient via MyChart and/or telephone call to  review. Labs with follow-up in 6 months.  He should also follow-up as needed for new or worrisome symptoms.  The patient understands the plans discussed today and is in agreement with them.  He knows to contact our office if he develops concerns prior to his next appointment.  I provided 20 minutes of face-to-face time during this encounter and > 50% was spent counseling as documented under my assessment and plan.    Carlean Jews, NP  [facility] [address] [place] [address] Dept: [phone] Dept Fax: [phone]   Orders Placed This Encounter  Procedures   CT CHEST ABDOMEN PELVIS W CONTRAST    Standing Status:   Future    Standing Expiration Date:   07/11/2024    Order Specific Question:   If indicated for the ordered procedure, I authorize the administration of contrast media per Radiology protocol    Answer:   Yes    Order Specific Question:   Does the patient have a contrast media/X-ray dye allergy?    Answer:   No    Order Specific Question:   Preferred imaging location?    Answer:   [facility]    Order Specific Question:   If indicated for the ordered procedure, I authorize the administration of oral contrast media per Radiology protocol    Answer:   Yes      CHIEF COMPLAINT:  CC: f/u colon cancer  Current Treatment: Surveillance  INTERVAL HISTORY:  Jimmy [name] is here today for repeat clinical assessment.  He last saw Dr. Mosetta Putt 03/01/2023.  He has been doing well overall with no concerns or complaints.  He had asked to wait till after the new year (2025) for new CT CAP.  This is ordered today.  He denies chest pain, chest pressure, or shortness of breath. He denies headaches or visual disturbances. He denies abdominal pain, nausea, vomiting, or changes in bowel or bladder habits.  He denies fevers or chills. He denies pain. His appetite is good. His weight has  decreased 4 pounds over last 4 months  .  I have reviewed the past medical history, past surgical history, social history and family history with the patient and they are unchanged from previous note.  ALLERGIES:  has No Known Allergies.  MEDICATIONS:  Current Outpatient Medications  Medication Sig Dispense Refill   amitriptyline (ELAVIL) 100 MG tablet Take 100 mg by mouth at bedtime.     sertraline (ZOLOFT) 100 MG tablet Take 100 mg by mouth every evening.     testosterone cypionate (DEPOTESTOSTERONE CYPIONATE) 200 MG/ML injection Inject 0.5 mLs into the skin every 14 (fourteen) days.     No current facility-administered medications for this visit.    HISTORY OF PRESENT ILLNESS:   Oncology History  Cancer of sigmoid colon (HCC)  08/14/2021 Procedure   Colonoscopy, Dr. Bosie Clos  Impression:  - Malignant partially obstructing tumor in the sigmoid colon. Biopsied. Tattooed.   08/14/2021 Initial Biopsy   INVASIVE MODERATELY DIFFERENTIATED ADENOCARCINOMA   09/20/2021 Imaging   EXAM: CT CHEST, ABDOMEN, AND PELVIS WITH CONTRAST  IMPRESSION: 1. Short segment focal wall thickening of the sigmoid colon, possibly reflecting patient's newly diagnosed colon cancer. 2. Prominent lymph nodes in the sigmoid mesentery, adjacent to the wall thickening, the largest of which measures 5 mm in short axis are nonspecific but suspicious for early nodal involvement. 3. Two tiny hypodense hepatic lesions measuring up to 6 mm, the larger of which was present on prior study dating back to September 02, 2019, but the 4 mm is new since then, and both of which are too small to accurately characterize. While these almost certainly reflect a benign etiology, given that the 4 mm lesion in the right lobe of liver is new from prior, a hepatic metastases in this patient with colon cancer can not be completely excluded. Suggest attention on follow-up imaging. 4. Otherwise, no definite evidence of distant metastatic  disease in the chest, abdomen or pelvis. 5. Diffuse hepatic steatosis. 6. Punctate nonobstructive left lower pole renal calculus. 7. Colonic diverticulosis without findings of acute diverticulitis.   10/19/2021 Initial Diagnosis   Cancer of sigmoid (HCC)   10/19/2021 Cancer Staging   Staging form: Colon and Rectum, AJCC 8th Edition - Pathologic stage from 10/19/2021: Stage IIIB (pT3, pN1b, cM0) - Signed by Malachy Mood, MD on 11/06/2021 Histologic grading system: 4 grade system Histologic grade (G): G2 Residual tumor (R): R0 - None   10/19/2021 Definitive Surgery   FINAL MICROSCOPIC DIAGNOSIS:   A.   LEFT COLON, RESECTION:  -    Adenocarcinoma, grade 2 (moderately differentiated), 4 cm in  greatest dimension, invasive through muscularis propria into pericolonic tissue.  -    All margins negative for tumor.  -    Metastatic carcinoma present in two of eighteen regional lymph  nodes (2/18).   B.   FINAL DISTAL MARGIN, EXCISION:  -    Negative for adenomatous change and malignancy.    11/23/2021 - 01/24/2022 Chemotherapy   Patient is on Treatment Plan : COLORECTAL Xelox (Capeox) q21d         REVIEW OF SYSTEMS:   Constitutional:  Denies fevers, chills or abnormal weight loss Eyes: Denies blurriness of vision Ears, nose, mouth, throat, and face: Denies mucositis or sore throat Respiratory: Denies cough, dyspnea or wheezes Cardiovascular: Denies palpitation, chest discomfort or lower extremity swelling Gastrointestinal:  Denies nausea, heartburn or change in bowel habits Skin: Denies abnormal skin rashes Lymphatics: Denies new lymphadenopathy or easy bruising Neurological:Denies numbness, tingling or new weaknesses Behavioral/Psych: Mood is stable, no new changes  All other systems were reviewed with the patient and are negative.   VITALS:   Today's Vitals   07/12/23 1108 07/12/23 1123  BP: 111/88   Pulse: 72   Resp: 18   Temp: (!) 97.5 F (36.4 C)   TempSrc: Temporal    SpO2: 97%   Weight: 238 lb 1.6 oz (108 kg)   PainSc:  0-No pain   Body mass index is 30.57 kg/m.   Wt Readings from Last 3 Encounters:  07/12/23 238 lb 1.6 oz (108 kg)  03/01/23 242 lb (109.8 kg)  07/19/22 239 lb 14.4 oz (108.8 kg)    Body mass index is 30.57 kg/m.  Performance status (ECOG): 0 - Asymptomatic  PHYSICAL EXAM:   GENERAL:alert, no distress and comfortable SKIN: skin color, texture, turgor are normal, no rashes or significant lesions EYES: normal, Conjunctiva are pink and non-injected, sclera clear OROPHARYNX:no exudate, no erythema and lips, buccal mucosa, and tongue normal  NECK: supple, thyroid normal size, non-tender, without nodularity LYMPH:  no palpable lymphadenopathy in the cervical, axillary or inguinal LUNGS: clear to auscultation and percussion with normal breathing effort HEART: regular rate & rhythm and no murmurs and no lower extremity edema ABDOMEN:abdomen soft, non-tender and normal bowel sounds Musculoskeletal:no cyanosis of digits and no clubbing  NEURO: alert & oriented x 3 with fluent speech, no focal motor/sensory deficits  LABORATORY DATA:  I have reviewed the data as listed    Component Value Date/Time   NA 138 07/12/2023 1053   K 4.1 07/12/2023 1053   CL 105 07/12/2023 1053   CO2 27 07/12/2023 1053   GLUCOSE 84 07/12/2023 1053   BUN 16 07/12/2023 1053   CREATININE 0.92 07/12/2023 1053   CALCIUM 9.3 07/12/2023 1053   PROT 7.0 07/12/2023 1053   ALBUMIN 4.2 07/12/2023 1053   AST 18 07/12/2023 1053   ALT 15 07/12/2023 1053   ALKPHOS 69 07/12/2023 1053   BILITOT 0.7 07/12/2023 1053   GFRNONAA >60 07/12/2023 1053   GFRAA >60 10/05/2018 1714    Lab Results  Component Value Date   WBC 4.4 07/12/2023   NEUTROABS 2.6 07/12/2023   HGB 15.2 07/12/2023   HCT 46.9 07/12/2023   MCV 93.6 07/12/2023   PLT 250 07/12/2023

## 2023-07-12 ENCOUNTER — Inpatient Hospital Stay: Payer: 59 | Attending: Hematology

## 2023-07-12 ENCOUNTER — Inpatient Hospital Stay (HOSPITAL_BASED_OUTPATIENT_CLINIC_OR_DEPARTMENT_OTHER): Payer: 59 | Admitting: Nurse Practitioner

## 2023-07-12 ENCOUNTER — Ambulatory Visit: Payer: 59 | Admitting: Nurse Practitioner

## 2023-07-12 ENCOUNTER — Other Ambulatory Visit: Payer: 59

## 2023-07-12 VITALS — BP 111/88 | HR 72 | Temp 97.5°F | Resp 18 | Wt 238.1 lb

## 2023-07-12 DIAGNOSIS — C787 Secondary malignant neoplasm of liver and intrahepatic bile duct: Secondary | ICD-10-CM | POA: Insufficient documentation

## 2023-07-12 DIAGNOSIS — C187 Malignant neoplasm of sigmoid colon: Secondary | ICD-10-CM | POA: Diagnosis not present

## 2023-07-12 DIAGNOSIS — C779 Secondary and unspecified malignant neoplasm of lymph node, unspecified: Secondary | ICD-10-CM | POA: Diagnosis not present

## 2023-07-12 LAB — CMP (CANCER CENTER ONLY)
ALT: 15 U/L (ref 0–44)
AST: 18 U/L (ref 15–41)
Albumin: 4.2 g/dL (ref 3.5–5.0)
Alkaline Phosphatase: 69 U/L (ref 38–126)
Anion gap: 6 (ref 5–15)
BUN: 16 mg/dL (ref 6–20)
CO2: 27 mmol/L (ref 22–32)
Calcium: 9.3 mg/dL (ref 8.9–10.3)
Chloride: 105 mmol/L (ref 98–111)
Creatinine: 0.92 mg/dL (ref 0.61–1.24)
GFR, Estimated: >60 mL/min (ref >60)
Glucose, Bld: 84 mg/dL (ref 70–99)
Potassium: 4.1 mmol/L (ref 3.5–5.1)
Sodium: 138 mmol/L (ref 135–145)
Total Bilirubin: 0.7 mg/dL (ref <1.2)
Total Protein: 7 g/dL (ref 6.5–8.1)

## 2023-07-12 LAB — CBC WITH DIFFERENTIAL (CANCER CENTER ONLY)
Abs Immature Granulocytes: 0.02 10*3/uL (ref 0.00–0.07)
Basophils Absolute: 0 10*3/uL (ref 0.0–0.1)
Basophils Relative: 1 %
Eosinophils Absolute: 0.2 10*3/uL (ref 0.0–0.5)
Eosinophils Relative: 6 %
HCT: 46.9 % (ref 39.0–52.0)
Hemoglobin: 15.2 g/dL (ref 13.0–17.0)
Immature Granulocytes: 1 %
Lymphocytes Relative: 21 %
Lymphs Abs: 0.9 10*3/uL (ref 0.7–4.0)
MCH: 30.3 pg (ref 26.0–34.0)
MCHC: 32.4 g/dL (ref 30.0–36.0)
MCV: 93.6 fL (ref 80.0–100.0)
Monocytes Absolute: 0.6 10*3/uL (ref 0.1–1.0)
Monocytes Relative: 14 %
Neutro Abs: 2.6 10*3/uL (ref 1.7–7.7)
Neutrophils Relative %: 57 %
Platelet Count: 250 10*3/uL (ref 150–400)
RBC: 5.01 MIL/uL (ref 4.22–5.81)
RDW: 12.9 % (ref 11.5–15.5)
WBC Count: 4.4 10*3/uL (ref 4.0–10.5)
nRBC: 0 % (ref 0.0–0.2)

## 2023-07-14 ENCOUNTER — Encounter: Payer: Self-pay | Admitting: Nurse Practitioner

## 2023-07-25 LAB — MOLECULAR PATHOLOGY

## 2023-09-06 ENCOUNTER — Ambulatory Visit (HOSPITAL_COMMUNITY): Admission: RE | Admit: 2023-09-06 | Payer: 59 | Source: Ambulatory Visit

## 2023-12-12 IMAGING — DX DG CHEST 1V PORT
1 series · 1 of 1 positions shown · non-contrast
Comparison: 4602

CLINICAL DATA: Chest pain

EXAM:
PORTABLE CHEST 1 VIEW

[chest ap]
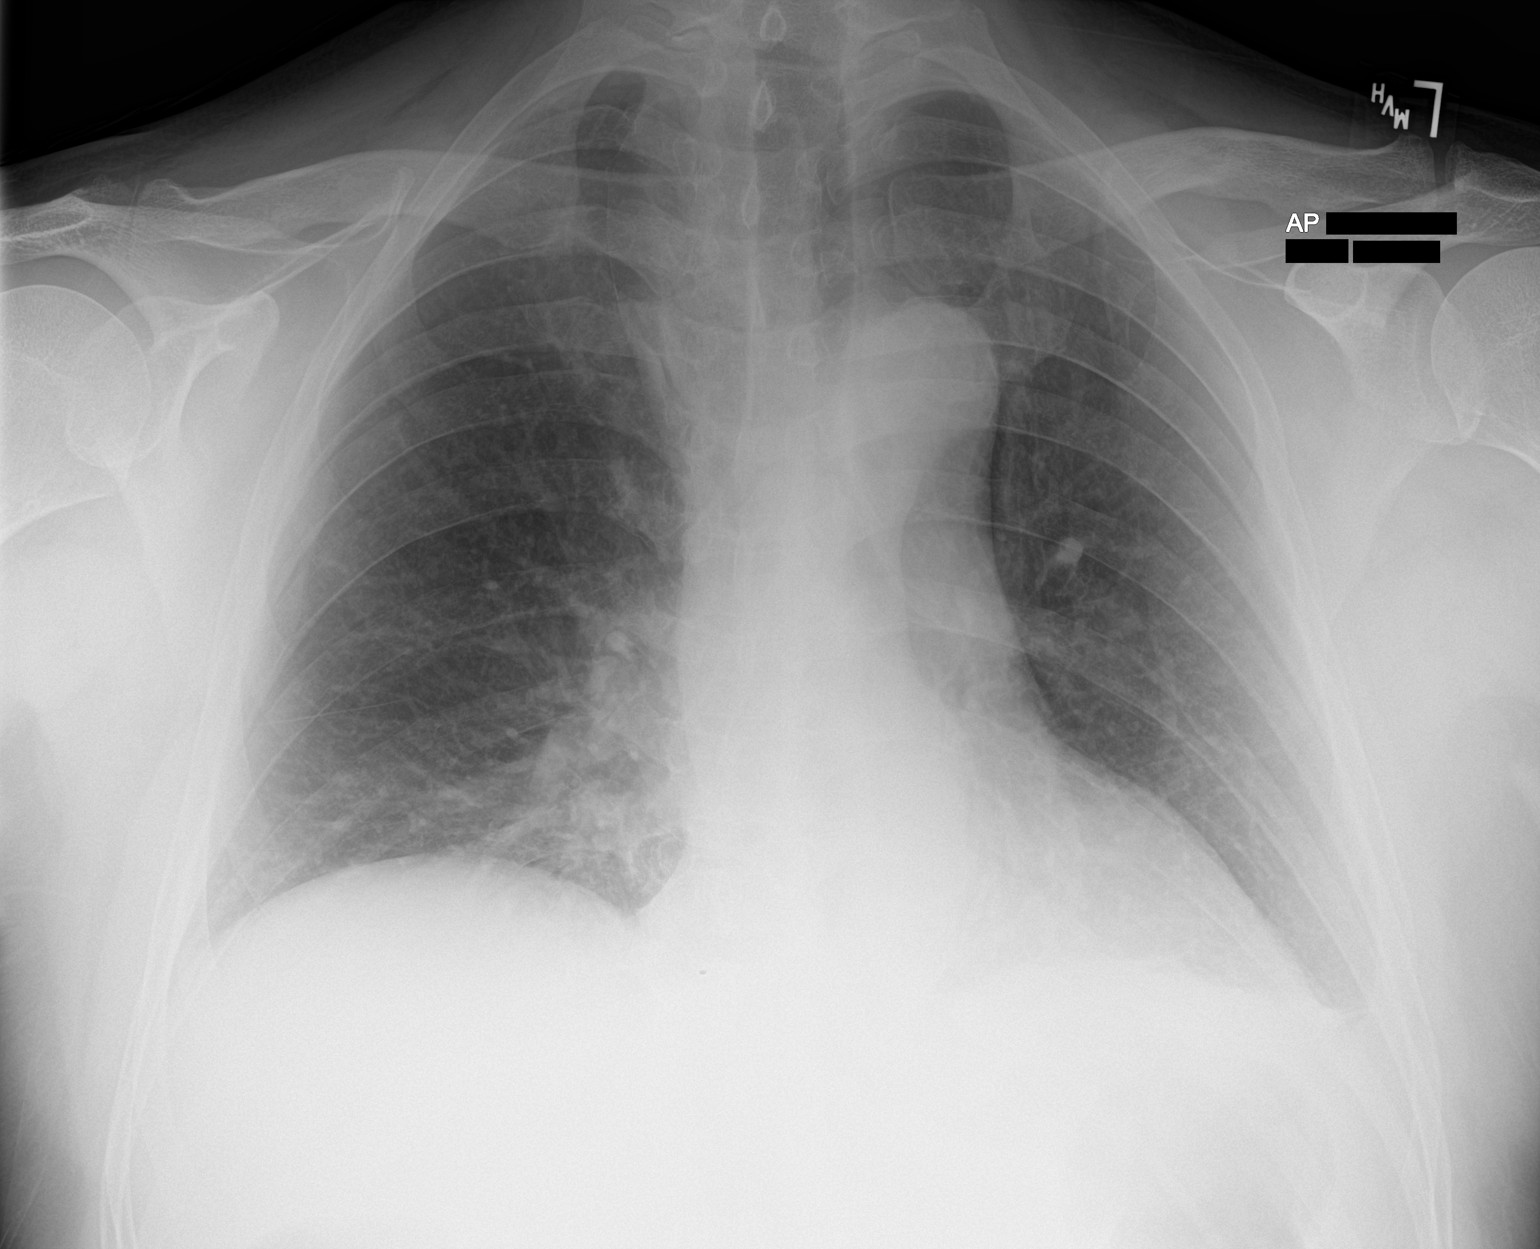

[1 of 1 positions shown; findings below may reference images not displayed]

FINDINGS: Right medial basilar opacity. Additional patchy density at the left
lung base. No significant pleural effusion. No pneumothorax. Heart
size is within normal limits for technique.
IMPRESSION: Bibasilar atelectasis/consolidation.

## 2023-12-12 IMAGING — CT CT ANGIO CHEST
2 of 6 series · 18 of 36 positions shown · IV contrast (agent unspecified)
Comparison: 09/20/2021, chest x-ray from earlier in the same day.

CLINICAL DATA: Chest pain and shortness of breath

EXAM:
CT ANGIOGRAPHY CHEST WITH CONTRAST
TECHNIQUE: Multidetector CT imaging of the chest was performed using the
standard protocol during bolus administration of intravenous
contrast. Multiplanar CT image reconstructions and MIPs were
obtained to evaluate the vascular anatomy.

[Series 5: thins · axial · 0.76mm/px · z∈[-272,-13]mm · 17 of 293 slices shown]
[im 17/293  lung]
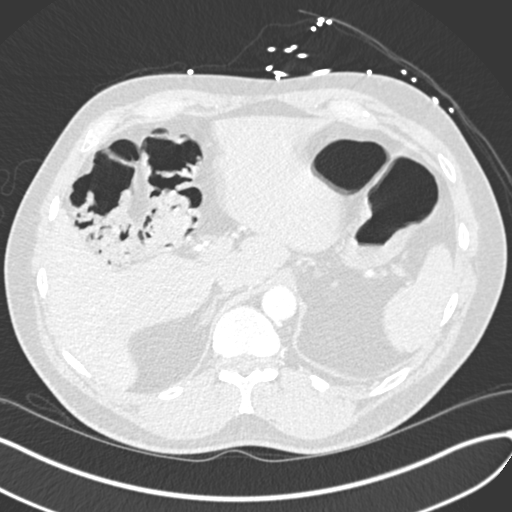
[im 33/293  mediastinal]
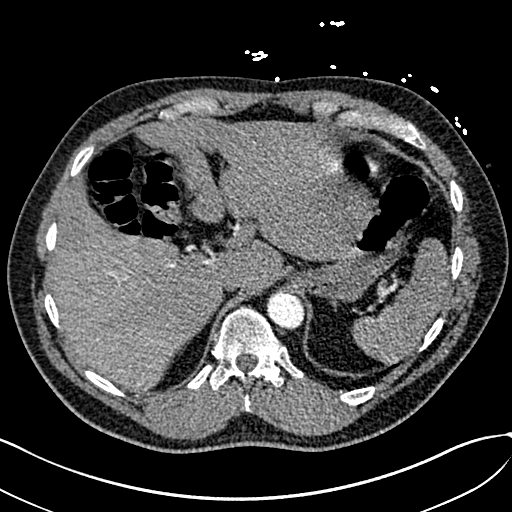
[im 49/293  lung]
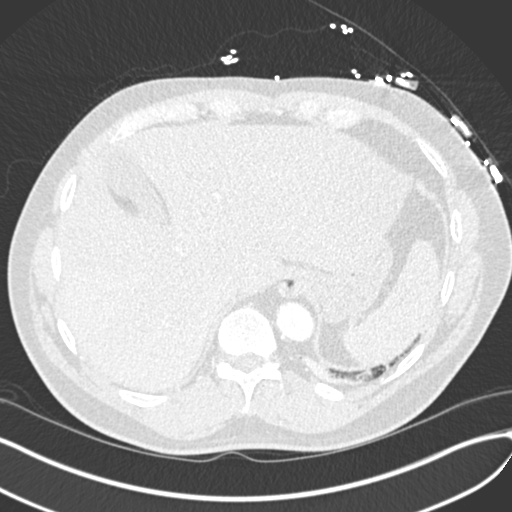
[im 65/293  mediastinal]
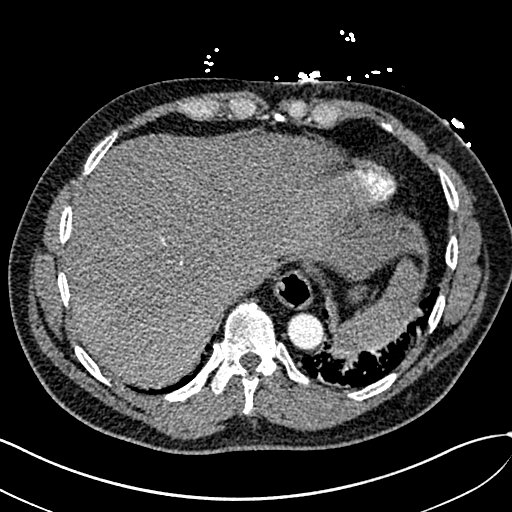
[im 82/293  lung]
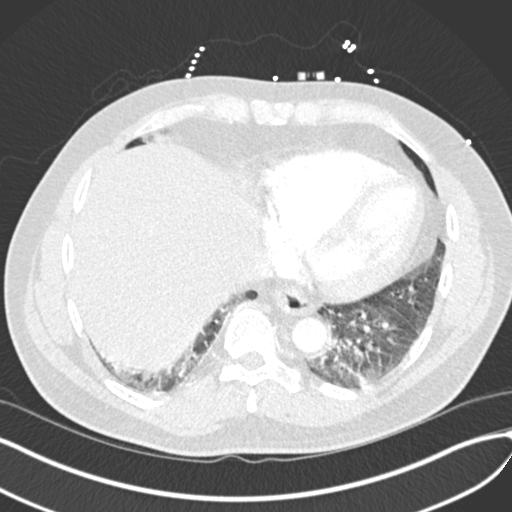
[im 98/293  mediastinal]
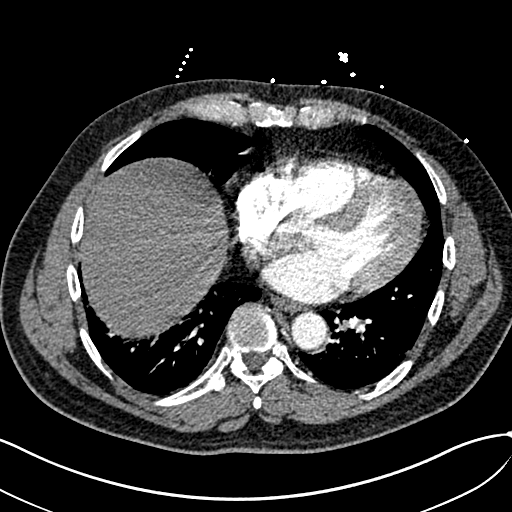
[im 114/293  lung]
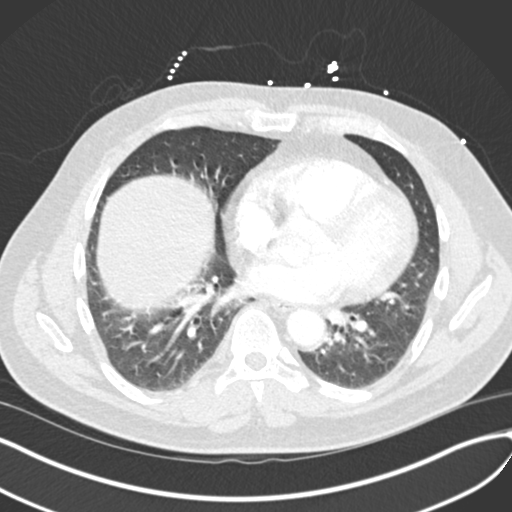
[im 130/293  mediastinal]
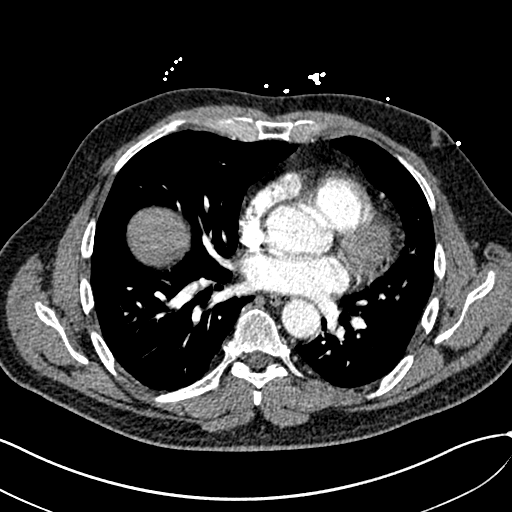
[im 147/293  lung]
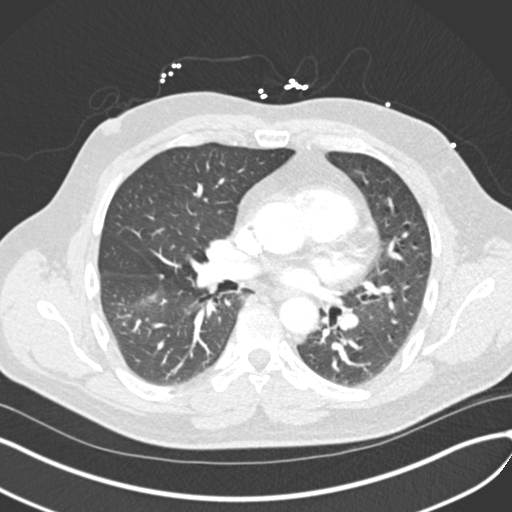
[im 163/293  mediastinal]
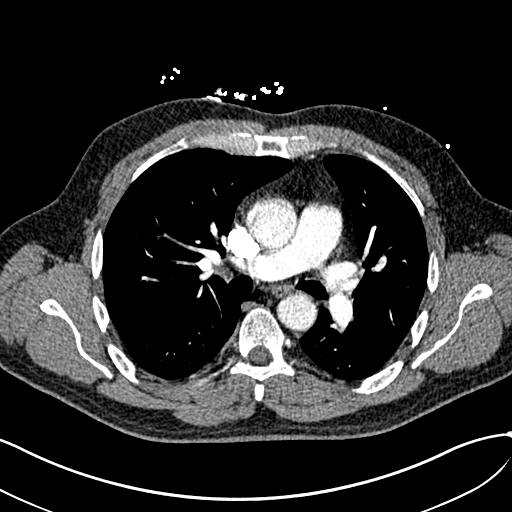
[im 179/293  lung]
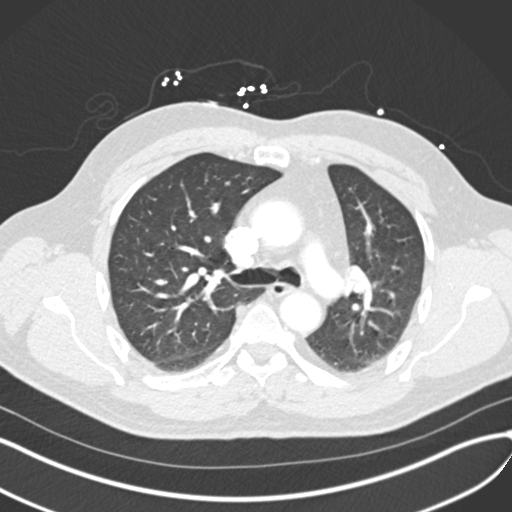
[im 195/293  mediastinal]
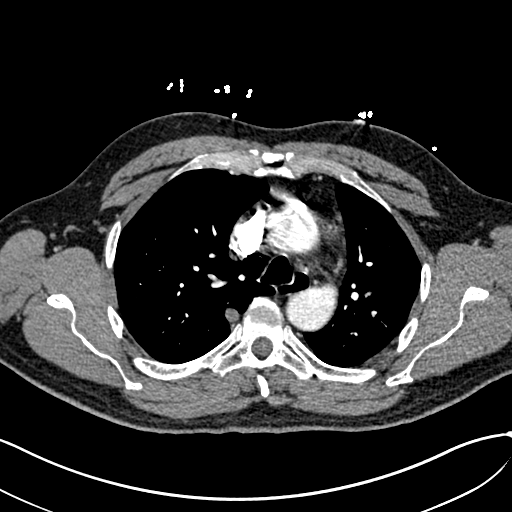
[im 211/293  lung]
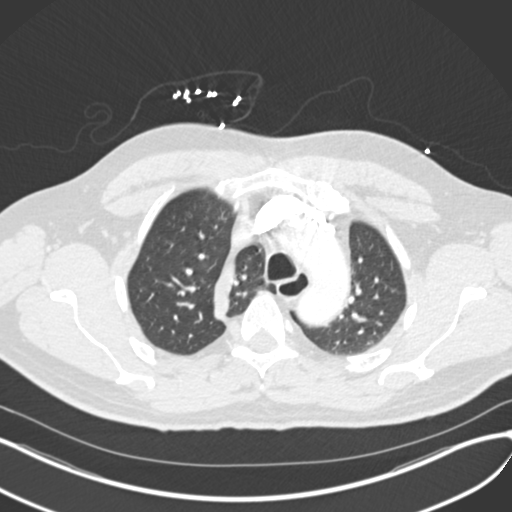
[im 228/293  mediastinal]
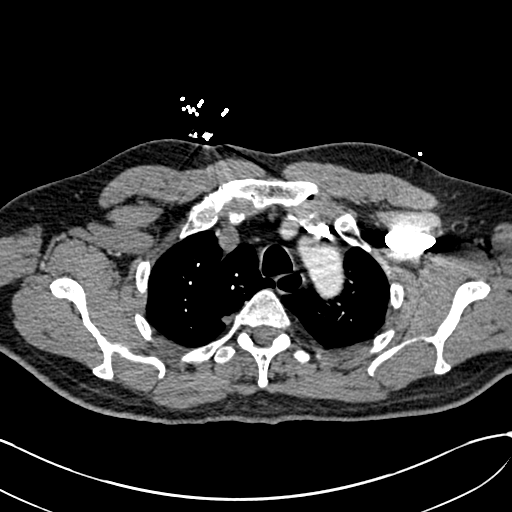
[im 244/293  lung]
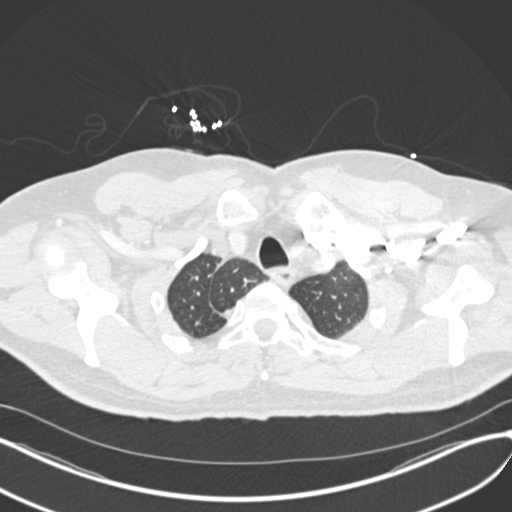
[im 260/293  mediastinal]
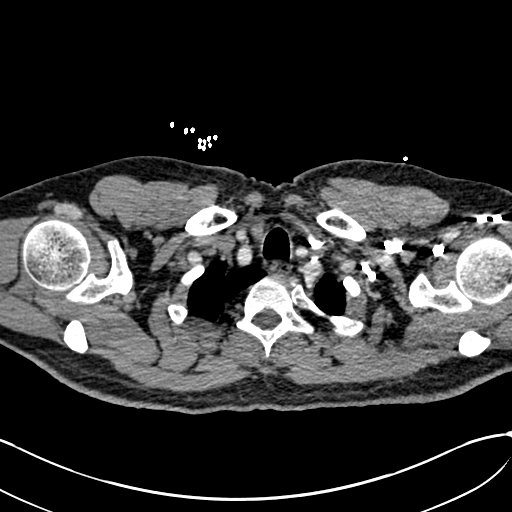
[im 276/293  lung]
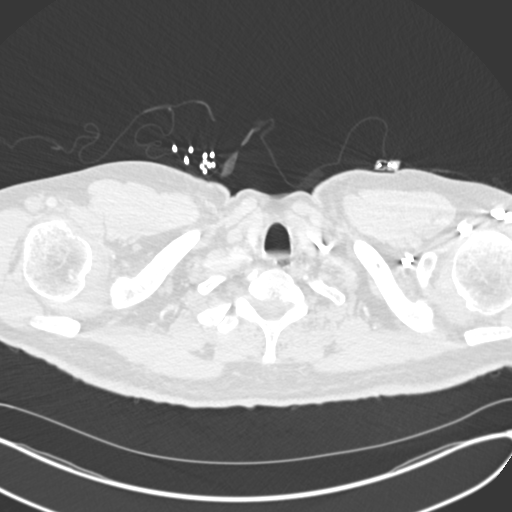

[Series 6: coronal mpr · coronal · 0.63mm/px · 1 of 143 slices shown]
[im 72/143  mediastinal]
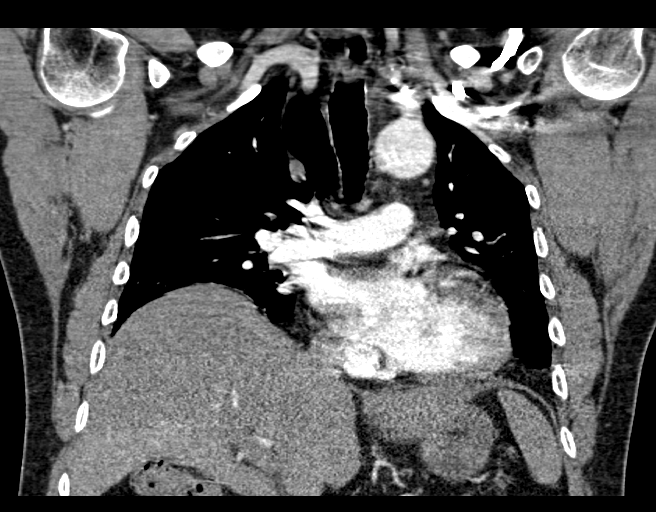

[18 of 36 positions shown; findings below may reference images not displayed]

RADIATION DOSE REDUCTION: This exam was performed according to the
departmental dose-optimization program which includes automated
exposure control, adjustment of the mA and/or kV according to
patient size and/or use of iterative reconstruction technique.

CONTRAST:  80mL OMNIPAQUE IOHEXOL 350 MG/ML SOLN
FINDINGS: Cardiovascular: Thoracic aorta demonstrates a normal branching
pattern. No aneurysmal dilatation or dissection is seen. The heart
is at the upper limits of normal in size. No coronary calcifications
are noted. The pulmonary artery shows no evidence of pulmonary
emboli.

Mediastinum/Nodes: Thoracic inlet is within normal limits. No
sizable hilar or mediastinal adenopathy is noted. The esophagus as
visualized is within normal limits.

Lungs/Pleura: Mild bibasilar atelectatic changes are seen. No focal
confluent infiltrate or sizable effusion is noted. As a gas lobe is
seen. No parenchymal nodules are noted.

Upper Abdomen: Degenerative changes of the thoracic spine are noted.
No acute rib abnormality is seen.

Musculoskeletal: No chest wall abnormality. No acute or significant
osseous findings.

Review of the MIP images confirms the above findings.
IMPRESSION: No evidence of pulmonary emboli.

Mild bibasilar atelectatic changes.

No other focal abnormality is seen.

## 2024-01-02 ENCOUNTER — Other Ambulatory Visit (HOSPITAL_COMMUNITY): Payer: Self-pay

## 2024-01-30 ENCOUNTER — Other Ambulatory Visit: Payer: Self-pay | Admitting: Nurse Practitioner

## 2024-01-30 DIAGNOSIS — C187 Malignant neoplasm of sigmoid colon: Secondary | ICD-10-CM

## 2024-01-30 NOTE — Progress Notes (Signed)
 Patient Care Team: Sabas Norleen PARAS., MD as PCP - General (Family Medicine) Sheldon Standing, MD as Consulting Physician (General Surgery) Dianna Specking, MD as Consulting Physician (Gastroenterology) Revankar, Jennifer SAUNDERS, MD as Consulting Physician (Cardiology) Lanny Callander, MD as Consulting Physician (Oncology)  Clinic Day:  01/31/2024  Referring physician: Sabas Norleen PARAS., MD  ASSESSMENT & PLAN:   Assessment & Plan: Cancer of sigmoid colon (HCC) stage IIIB p(T3, N1b), MMR normal  -presented with intermittent rectal bleeding for 6 months, first colonoscopy on 08/14/21 by Dr. Dianna showed sigmoid colon mass, biopsy confirmed adenocarcinoma. -baseline CEA on 09/12/21 was WNL. -CT CAP on 09/20/21 showed: wall thickening of sigmoid colon; nonspecific but suspicious sigmoid mesentery lymph nodes; two indeterminate tiny hepatic lesions; otherwise no definite metastatic disease.  -left hemicolectomy on 10/19/21 by Dr. Sheldon showed 4 cm adenocarcinoma, grade 2 (moderately differentiated), invasive through muscularis propria into pericolonic tissue; margins negative; metastatic carcinoma in two lymph nodes (2/18).  No lymphovascular or perineural invasion. -liver MRI on 11/24/21 showed benign cysts, no evidence of liver or other metastatic disease. -he completed 4 cycles adjuvant CAPOX 11/23/21 - 01/24/22. He tolerated moderately well with fatigue, nausea, and cold sensitivity.  -Surveillant CT CAP 10/17/2022 showed NED.  -He is clinically doing well, lab reviewed, exam was unremarkable, there is no clinical concern for recurrence. - Surveillance CT CAP to be done at [facility] imaging [facility]).  Will have scheduled in the near future. - Continue cancer surveillance with labs and follow-up in 6 months then moved to annual follow-ups after that.   Plan Reviewed labs. - Unremarkable CBC and CMP. - CEA normal at 2.81. New CT CAP ordered for surveillance sigmoid colon cancer.  Will request this to  be done at [facility] imaging [facility]). Labs and follow-up in 6 months, sooner if needed.   The patient understands the plans discussed today and is in agreement with them.  He knows to contact our office if he develops concerns prior to his next appointment.  I provided 25 minutes of face-to-face time during this encounter and > 50% was spent counseling as documented under my assessment and plan.    Powell FORBES Lessen, NP  [facility] [facility] [address] [place] [address] Dept: [phone] Dept Fax: [phone]   Orders Placed This Encounter  Procedures   CT CHEST ABDOMEN PELVIS W CONTRAST    Standing Status:   Future    Expected Date:   02/23/2024    Expiration Date:   02/08/2025    If indicated for the ordered procedure, I authorize the administration of contrast media per Radiology protocol:   Yes    Does the patient have a contrast media/X-ray dye allergy?:   No    Preferred imaging location?:   [facility]    If indicated for the ordered procedure, I authorize the administration of oral contrast media per Radiology protocol:   Yes      CHIEF COMPLAINT:  CC: Cancer of sigmoid colon  Current Treatment: Surveillance  INTERVAL HISTORY:  Jimmy [name] is here today for repeat clinical assessment.  He was last seen by me on 07/12/2023.  CT CAP for surveillance was ordered on that date. Patient had insurance issues and wished to hold off on CT CAP.  Insurance coverage better at different imaging facility than at [facility]  Will have new scans scheduled.  He reports feeling  well.  He denies chest pain, chest pressure, or shortness of breath. He denies headaches or visual disturbances. He denies abdominal pain, nausea, vomiting, or changes in bowel or bladder habits.   He denies fevers or chills. He denies pain. His appetite is good. His weight has been stable.  I have reviewed  the past medical history, past surgical history, social history and family history with the patient and they are unchanged from previous note.  ALLERGIES:  has no known allergies.  MEDICATIONS:  Current Outpatient Medications  Medication Sig Dispense Refill   amitriptyline  (ELAVIL ) 100 MG tablet Take 100 mg by mouth at bedtime.     sertraline  (ZOLOFT ) 100 MG tablet Take 100 mg by mouth every evening.     testosterone  cypionate (DEPOTESTOSTERONE CYPIONATE) 200 MG/ML injection Inject 0.5 mLs into the skin every 14 (fourteen) days.     No current facility-administered medications for this visit.    HISTORY OF PRESENT ILLNESS:   Oncology History  Cancer of sigmoid colon (HCC)  08/14/2021 Procedure   Colonoscopy, Dr. Dianna  Impression:  - Malignant partially obstructing tumor in the sigmoid colon. Biopsied. Tattooed.   08/14/2021 Initial Biopsy   INVASIVE MODERATELY DIFFERENTIATED ADENOCARCINOMA   09/20/2021 Imaging   EXAM: CT CHEST, ABDOMEN, AND PELVIS WITH CONTRAST  IMPRESSION: 1. Short segment focal wall thickening of the sigmoid colon, possibly reflecting patient's newly diagnosed colon cancer. 2. Prominent lymph nodes in the sigmoid mesentery, adjacent to the wall thickening, the largest of which measures 5 mm in short axis are nonspecific but suspicious for early nodal involvement. 3. Two tiny hypodense hepatic lesions measuring up to 6 mm, the larger of which was present on prior study dating back to September 02, 2019, but the 4 mm is new since then, and both of which are too small to accurately characterize. While these almost certainly reflect a benign etiology, given that the 4 mm lesion in the right lobe of liver is new from prior, a hepatic metastases in this patient with colon cancer can not be completely excluded. Suggest attention on follow-up imaging. 4. Otherwise, no definite evidence of distant metastatic disease in the chest, abdomen or pelvis. 5. Diffuse  hepatic steatosis. 6. Punctate nonobstructive left lower pole renal calculus. 7. Colonic diverticulosis without findings of acute diverticulitis.   10/19/2021 Initial Diagnosis   Cancer of sigmoid (HCC)   10/19/2021 Cancer Staging   Staging form: Colon and Rectum, AJCC 8th Edition - Pathologic stage from 10/19/2021: Stage IIIB (pT3, pN1b, cM0) - Signed by Lanny Callander, MD on 11/06/2021 Histologic grading system: 4 grade system Histologic grade (G): G2 Residual tumor (R): R0 - None   10/19/2021 Definitive Surgery   FINAL MICROSCOPIC DIAGNOSIS:   A.   LEFT COLON, RESECTION:  -    Adenocarcinoma, grade 2 (moderately differentiated), 4 cm in  greatest dimension, invasive through muscularis propria into pericolonic tissue.  -    All margins negative for tumor.  -    Metastatic carcinoma present in two of eighteen regional lymph  nodes (2/18).   B.   FINAL DISTAL MARGIN, EXCISION:  -    Negative for adenomatous change and malignancy.    11/23/2021 - 01/24/2022 Chemotherapy   Patient is on Treatment Plan : COLORECTAL Xelox (Capeox) q21d         REVIEW OF SYSTEMS:   Constitutional: Denies fevers, chills or abnormal weight loss Eyes: Denies blurriness of vision Ears, nose, mouth, throat, and face: Denies mucositis or sore throat Respiratory: Denies  cough, dyspnea or wheezes Cardiovascular: Denies palpitation, chest discomfort or lower extremity swelling Gastrointestinal:  Denies nausea, heartburn or change in bowel habits Skin: Denies abnormal skin rashes Lymphatics: Denies new lymphadenopathy or easy bruising Neurological:Denies numbness, tingling or new weaknesses Behavioral/Psych: Mood is stable, no new changes  All other systems were reviewed with the patient and are negative.   VITALS:   Today's Vitals   01/31/24 1146 01/31/24 1158  BP: 132/84   Pulse: 87   Resp: 17   Temp: 98.1 F (36.7 C)   SpO2: 97%   Weight: 238 lb 6.4 oz (108.1 kg)   PainSc:  0-No pain   Body mass  index is 30.61 kg/m.   Wt Readings from Last 3 Encounters:  01/31/24 238 lb 6.4 oz (108.1 kg)  07/12/23 238 lb 1.6 oz (108 kg)  03/01/23 242 lb (109.8 kg)    Body mass index is 30.61 kg/m.  Performance status (ECOG): 0 - Asymptomatic  PHYSICAL EXAM:   GENERAL:alert, no distress and comfortable SKIN: skin color, texture, turgor are normal, no rashes or significant lesions EYES: normal, Conjunctiva are pink and non-injected, sclera clear OROPHARYNX:no exudate, no erythema and lips, buccal mucosa, and tongue normal  NECK: supple, thyroid normal size, non-tender, without nodularity LYMPH:  no palpable lymphadenopathy in the cervical, axillary or inguinal LUNGS: clear to auscultation and percussion with normal breathing effort HEART: regular rate & rhythm and no murmurs and no lower extremity edema ABDOMEN:abdomen soft, non-tender and normal bowel sounds Musculoskeletal:no cyanosis of digits and no clubbing  NEURO: alert & oriented x 3 with fluent speech, no focal motor/sensory deficits  LABORATORY DATA:  I have reviewed the data as listed    Component Value Date/Time   NA 139 01/31/2024 1134   K 3.7 01/31/2024 1134   CL 107 01/31/2024 1134   CO2 26 01/31/2024 1134   GLUCOSE 94 01/31/2024 1134   BUN 18 01/31/2024 1134   CREATININE 0.93 01/31/2024 1134   CALCIUM  9.3 01/31/2024 1134   PROT 7.5 01/31/2024 1134   ALBUMIN 4.4 01/31/2024 1134   AST 23 01/31/2024 1134   ALT 20 01/31/2024 1134   ALKPHOS 77 01/31/2024 1134   BILITOT 1.2 01/31/2024 1134   GFRNONAA >60 01/31/2024 1134   GFRAA >60 10/05/2018 1714    Lab Results  Component Value Date   WBC 5.2 01/31/2024   NEUTROABS 3.3 01/31/2024   HGB 15.1 01/31/2024   HCT 44.2 01/31/2024   MCV 90.9 01/31/2024   PLT 248 01/31/2024

## 2024-01-30 NOTE — Assessment & Plan Note (Addendum)
 stage IIIB p(T3, N1b), MMR normal  -presented with intermittent rectal bleeding for 6 months, first colonoscopy on 08/14/21 by Dr. Dianna showed sigmoid colon mass, biopsy confirmed adenocarcinoma. -baseline CEA on 09/12/21 was WNL. -CT CAP on 09/20/21 showed: wall thickening of sigmoid colon; nonspecific but suspicious sigmoid mesentery lymph nodes; two indeterminate tiny hepatic lesions; otherwise no definite metastatic disease.  -left hemicolectomy on 10/19/21 by Dr. Sheldon showed 4 cm adenocarcinoma, grade 2 (moderately differentiated), invasive through muscularis propria into pericolonic tissue; margins negative; metastatic carcinoma in two lymph nodes (2/18).  No lymphovascular or perineural invasion. -liver MRI on 11/24/21 showed benign cysts, no evidence of liver or other metastatic disease. -he completed 4 cycles adjuvant CAPOX 11/23/21 - 01/24/22. He tolerated moderately well with fatigue, nausea, and cold sensitivity.  -Surveillant CT CAP 10/17/2022 showed NED.  -He is clinically doing well, lab reviewed, exam was unremarkable, there is no clinical concern for recurrence. - Surveillance CT CAP to be done at [facility]).  Will have scheduled in the near future. - Continue cancer surveillance with labs and follow-up in 6 months then moved to annual follow-ups after that.

## 2024-01-31 ENCOUNTER — Inpatient Hospital Stay: Payer: 59 | Attending: Nurse Practitioner

## 2024-01-31 ENCOUNTER — Inpatient Hospital Stay (HOSPITAL_BASED_OUTPATIENT_CLINIC_OR_DEPARTMENT_OTHER): Payer: 59 | Admitting: Nurse Practitioner

## 2024-01-31 ENCOUNTER — Ambulatory Visit: Payer: Self-pay | Admitting: Nurse Practitioner

## 2024-01-31 VITALS — BP 132/84 | HR 87 | Temp 98.1°F | Resp 17 | Wt 238.4 lb

## 2024-01-31 DIAGNOSIS — C787 Secondary malignant neoplasm of liver and intrahepatic bile duct: Secondary | ICD-10-CM | POA: Diagnosis not present

## 2024-01-31 DIAGNOSIS — C779 Secondary and unspecified malignant neoplasm of lymph node, unspecified: Secondary | ICD-10-CM | POA: Diagnosis not present

## 2024-01-31 DIAGNOSIS — C7989 Secondary malignant neoplasm of other specified sites: Secondary | ICD-10-CM | POA: Diagnosis not present

## 2024-01-31 DIAGNOSIS — C187 Malignant neoplasm of sigmoid colon: Secondary | ICD-10-CM | POA: Diagnosis present

## 2024-01-31 LAB — CBC WITH DIFFERENTIAL (CANCER CENTER ONLY)
Abs Immature Granulocytes: 0.02 10*3/uL (ref 0.00–0.07)
Basophils Absolute: 0 10*3/uL (ref 0.0–0.1)
Basophils Relative: 1 %
Eosinophils Absolute: 0.3 10*3/uL (ref 0.0–0.5)
Eosinophils Relative: 5 %
HCT: 44.2 % (ref 39.0–52.0)
Hemoglobin: 15.1 g/dL (ref 13.0–17.0)
Immature Granulocytes: 0 %
Lymphocytes Relative: 17 %
Lymphs Abs: 0.9 10*3/uL (ref 0.7–4.0)
MCH: 31.1 pg (ref 26.0–34.0)
MCHC: 34.2 g/dL (ref 30.0–36.0)
MCV: 90.9 fL (ref 80.0–100.0)
Monocytes Absolute: 0.7 10*3/uL (ref 0.1–1.0)
Monocytes Relative: 14 %
Neutro Abs: 3.3 10*3/uL (ref 1.7–7.7)
Neutrophils Relative %: 63 %
Platelet Count: 248 10*3/uL (ref 150–400)
RBC: 4.86 MIL/uL (ref 4.22–5.81)
RDW: 13.2 % (ref 11.5–15.5)
WBC Count: 5.2 10*3/uL (ref 4.0–10.5)
nRBC: 0 % (ref 0.0–0.2)

## 2024-01-31 LAB — CMP (CANCER CENTER ONLY)
ALT: 20 U/L (ref 0–44)
AST: 23 U/L (ref 15–41)
Albumin: 4.4 g/dL (ref 3.5–5.0)
Alkaline Phosphatase: 77 U/L (ref 38–126)
Anion gap: 6 (ref 5–15)
BUN: 18 mg/dL (ref 6–20)
CO2: 26 mmol/L (ref 22–32)
Calcium: 9.3 mg/dL (ref 8.9–10.3)
Chloride: 107 mmol/L (ref 98–111)
Creatinine: 0.93 mg/dL (ref 0.61–1.24)
GFR, Estimated: >60 mL/min (ref >60)
Glucose, Bld: 94 mg/dL (ref 70–99)
Potassium: 3.7 mmol/L (ref 3.5–5.1)
Sodium: 139 mmol/L (ref 135–145)
Total Bilirubin: 1.2 mg/dL (ref 0.0–1.2)
Total Protein: 7.5 g/dL (ref 6.5–8.1)

## 2024-01-31 LAB — CEA (ACCESS): CEA (CHCC): 2.81 ng/mL (ref 0.00–5.00)

## 2024-02-09 ENCOUNTER — Encounter: Payer: Self-pay | Admitting: Nurse Practitioner

## 2024-02-24 ENCOUNTER — Inpatient Hospital Stay: Admission: RE | Admit: 2024-02-24 | Source: Ambulatory Visit

## 2024-03-02 ENCOUNTER — Ambulatory Visit
Admission: RE | Admit: 2024-03-02 | Discharge: 2024-03-02 | Disposition: A | Discharge status: Home / Self Care | Source: Ambulatory Visit | Attending: Nurse Practitioner

## 2024-03-02 DIAGNOSIS — C187 Malignant neoplasm of sigmoid colon: Secondary | ICD-10-CM

## 2024-03-02 MED ORDER — IOPAMIDOL (ISOVUE-300) INJECTION 61%
100.0000 mL | Freq: Once | INTRAVENOUS | Status: AC | PRN
  Administered 2024-03-02: 100 mL via INTRAVENOUS

## 2024-03-09 ENCOUNTER — Ambulatory Visit: Payer: Self-pay | Admitting: Nurse Practitioner

## 2024-08-14 ENCOUNTER — Ambulatory Visit: Admitting: Nurse Practitioner

## 2024-08-14 ENCOUNTER — Other Ambulatory Visit

## 2024-08-27 ENCOUNTER — Other Ambulatory Visit: Payer: Self-pay

## 2024-08-27 DIAGNOSIS — C187 Malignant neoplasm of sigmoid colon: Secondary | ICD-10-CM

## 2024-08-27 NOTE — Progress Notes (Signed)
 " Patient Care Team: Sabas Norleen PARAS., MD as PCP - General (Family Medicine) Sheldon Standing, MD as Consulting Physician (General Surgery) Dianna Specking, MD as Consulting Physician (Gastroenterology) Revankar, Jennifer SAUNDERS, MD as Consulting Physician (Cardiology) Lanny Callander, MD as Consulting Physician (Oncology)  Clinic Day:  08/28/2024  Referring physician: Sabas Norleen PARAS., MD  ASSESSMENT & PLAN:   Assessment & Plan: Cancer of sigmoid colon (HCC) stage IIIB p(T3, N1b), MMR normal  -presented with intermittent rectal bleeding for 6 months, first colonoscopy on 08/14/21 by Dr. Dianna showed sigmoid colon mass, biopsy confirmed adenocarcinoma. -baseline CEA on 09/12/21 was WNL. -CT CAP on 09/20/21 showed: wall thickening of sigmoid colon; nonspecific but suspicious sigmoid mesentery lymph nodes; two indeterminate tiny hepatic lesions; otherwise no definite metastatic disease.  -left hemicolectomy on 10/19/21 by Dr. Sheldon showed 4 cm adenocarcinoma, grade 2 (moderately differentiated), invasive through muscularis propria into pericolonic tissue; margins negative; metastatic carcinoma in two lymph nodes (2/18).  No lymphovascular or perineural invasion. -liver MRI on 11/24/21 showed benign cysts, no evidence of liver or other metastatic disease. -he completed 4 cycles adjuvant CAPOX 11/23/21 - 01/24/22. He tolerated moderately well with fatigue, nausea, and cold sensitivity.  -Surveillant CT CAP 10/17/2022 showed NED.  -He is clinically doing well, lab reviewed, exam was unremarkable, there is no clinical concern for recurrence. - Surveillance CT CAP to be done at [facility]).  Done on 03/02/2024 showing stable surgical changes and no evidence of recurrence or metastatic disease in the chest, abdomen, or pelvis. - 08/28/2024 -patient clinically doing very well.  Labs reviewed and unremarkable.  Clinical presentation is benign.  Exam is benign.  Will plan for labs and follow-up in 1  year with surveillance CT CAP in late July or early August 2026.    Cancer of sigmoid colon stage IIIb Patient is clinically doing very well.  Reviewed CT CAP from 02/2024.  There were stable surgical changes without evidence of recurrence or metastatic disease in the chest, abdomen, or pelvis.  CEA today is normal at 2.23.  Will move to annual follow-ups with labs.  Repeat CT CAP in 03/2025.  Plan Labs reviewed. -Unremarkable CBC and CMP. - Reviewed normal at 2.23. CT CAP from 02/2024 showing stable surgical changes with no evidence of recurrence or metastatic disease. Surveillance CT CAP ordered for 03/2025. Plan for labs and follow-up in 1 year, sooner if needed.   The patient understands the plans discussed today and is in agreement with them.  He knows to contact our office if he develops concerns prior to his next appointment.  I provided 20 minutes of face-to-face time during this encounter and > 50% was spent counseling as documented under my assessment and plan.    Powell FORBES Lessen, NP  [facility] - A DEPT OF [facility] [address] [place] [address] Dept: [phone] Dept Fax: [phone]   Orders Placed This Encounter  Procedures   CT CHEST ABDOMEN PELVIS W CONTRAST    Standing Status:   Future    Expected Date:   03/03/2025    Expiration Date:   08/31/2025    If indicated for the ordered procedure, I authorize the administration of contrast media per Radiology protocol:   Yes    Does the patient have a contrast media/X-ray dye allergy?:   No    If indicated for the ordered procedure, I authorize the administration of oral contrast  media per Radiology protocol:   Yes    Preferred imaging location?:   [facility]      CHIEF COMPLAINT:  CC: Cancer of sigmoid colon  Current Treatment: Surveillance  INTERVAL HISTORY:  Jimmy [name] is here today for repeat clinical assessment.  He last saw me  on 01/31/2024.  CT CAP done on 03/02/2024 showed no evidence of metastatic disease or recurrence in the chest, abdomen, or pelvis.  He reports doing very well.  He denies presence of peripheral neuropathy and hands, fingers, or feet.  He denies chest pain, chest pressure, or shortness of breath. He denies headaches or visual disturbances. He denies abdominal pain, nausea, vomiting, or changes in bowel or bladder habits.  He denies unusual bleeding such as blood in his stool, hematemesis, or hemoptysis.    He denies fevers or chills. He denies pain. His appetite is good. His weight has increased 6 pounds over last 6 months.  I have reviewed the past medical history, past surgical history, social history and family history with the patient and they are unchanged from previous note.  ALLERGIES:  has no known allergies.  MEDICATIONS:  Current Outpatient Medications  Medication Sig Dispense Refill   amitriptyline  (ELAVIL ) 100 MG tablet Take 100 mg by mouth at bedtime.     sertraline  (ZOLOFT ) 100 MG tablet Take 100 mg by mouth every evening.     testosterone  cypionate (DEPOTESTOSTERONE CYPIONATE) 200 MG/ML injection Inject 0.5 mLs into the skin every 14 (fourteen) days.     No current facility-administered medications for this visit.    HISTORY OF PRESENT ILLNESS:   Oncology History  Cancer of sigmoid colon (HCC)  08/14/2021 Procedure   Colonoscopy, Dr. Dianna  Impression:  - Malignant partially obstructing tumor in the sigmoid colon. Biopsied. Tattooed.   08/14/2021 Initial Biopsy   INVASIVE MODERATELY DIFFERENTIATED ADENOCARCINOMA   09/20/2021 Imaging   EXAM: CT CHEST, ABDOMEN, AND PELVIS WITH CONTRAST  IMPRESSION: 1. Short segment focal wall thickening of the sigmoid colon, possibly reflecting patient's newly diagnosed colon cancer. 2. Prominent lymph nodes in the sigmoid mesentery, adjacent to the wall thickening, the largest of which measures 5 mm in short axis are nonspecific but  suspicious for early nodal involvement. 3. Two tiny hypodense hepatic lesions measuring up to 6 mm, the larger of which was present on prior study dating back to September 02, 2019, but the 4 mm is new since then, and both of which are too small to accurately characterize. While these almost certainly reflect a benign etiology, given that the 4 mm lesion in the right lobe of liver is new from prior, a hepatic metastases in this patient with colon cancer can not be completely excluded. Suggest attention on follow-up imaging. 4. Otherwise, no definite evidence of distant metastatic disease in the chest, abdomen or pelvis. 5. Diffuse hepatic steatosis. 6. Punctate nonobstructive left lower pole renal calculus. 7. Colonic diverticulosis without findings of acute diverticulitis.   10/19/2021 Initial Diagnosis   Cancer of sigmoid (HCC)   10/19/2021 Cancer Staging   Staging form: Colon and Rectum, AJCC 8th Edition - Pathologic stage from 10/19/2021: Stage IIIB (pT3, pN1b, cM0) - Signed by Lanny Callander, MD on 11/06/2021 Histologic grading system: 4 grade system Histologic grade (G): G2 Residual tumor (R): R0 - None   10/19/2021 Definitive Surgery   FINAL MICROSCOPIC DIAGNOSIS:   A.   LEFT COLON, RESECTION:  -    Adenocarcinoma, grade 2 (moderately differentiated), 4 cm in  greatest dimension, invasive  through muscularis propria into pericolonic tissue.  -    All margins negative for tumor.  -    Metastatic carcinoma present in two of eighteen regional lymph  nodes (2/18).   B.   FINAL DISTAL MARGIN, EXCISION:  -    Negative for adenomatous change and malignancy.    11/23/2021 - 01/24/2022 Chemotherapy   Patient is on Treatment Plan : COLORECTAL Xelox (Capeox) q21d     03/02/2024 Imaging   CT chest, abdomen, and pelvis with contrast  IMPRESSION: 1. Stable surgical changes involving the sigmoid colon. No complicating features. 2. No findings for locally recurrent disease or metastatic disease  involving the chest, abdomen or pelvis. 3. Stable mild hepatic steatosis.       REVIEW OF SYSTEMS:   Constitutional: Denies fevers, chills or abnormal weight loss Eyes: Denies blurriness of vision Ears, nose, mouth, throat, and face: Denies mucositis or sore throat Respiratory: Denies cough, dyspnea or wheezes Cardiovascular: Denies palpitation, chest discomfort or lower extremity swelling Gastrointestinal:  Denies nausea, heartburn or change in bowel habits Skin: Denies abnormal skin rashes Lymphatics: Denies new lymphadenopathy or easy bruising Neurological:Denies numbness, tingling or new weaknesses Behavioral/Psych: Mood is stable, no new changes  All other systems were reviewed with the patient and are negative.   VITALS:   Today's Vitals   08/28/24 1426 08/28/24 1427  BP: (!) 130/107 (!) 138/103  Pulse: 88   Resp: 17   Temp: 98.3 F (36.8 C)   SpO2: 95%   Weight: 244 lb 11.2 oz (111 kg)   PainSc:  0-No pain   Body mass index is 31.42 kg/m.   Wt Readings from Last 3 Encounters:  08/28/24 244 lb 11.2 oz (111 kg)  01/31/24 238 lb 6.4 oz (108.1 kg)  07/12/23 238 lb 1.6 oz (108 kg)    Body mass index is 31.42 kg/m.  Performance status (ECOG): 0 - Asymptomatic  PHYSICAL EXAM:   GENERAL:alert, no distress and comfortable SKIN: skin color, texture, turgor are normal, no rashes or significant lesions EYES: normal, Conjunctiva are pink and non-injected, sclera clear OROPHARYNX:no exudate, no erythema and lips, buccal mucosa, and tongue normal  NECK: supple, thyroid normal size, non-tender, without nodularity LYMPH:  no palpable lymphadenopathy in the cervical, axillary or inguinal LUNGS: clear to auscultation and percussion with normal breathing effort HEART: regular rate & rhythm and no murmurs and no lower extremity edema ABDOMEN:abdomen soft, non-tender and normal bowel sounds Musculoskeletal:no cyanosis of digits and no clubbing  NEURO: alert & oriented x 3  with fluent speech, no focal motor/sensory deficits  LABORATORY DATA:  I have reviewed the data as listed    Component Value Date/Time   NA 138 08/28/2024 1359   K 4.1 08/28/2024 1359   CL 101 08/28/2024 1359   CO2 26 08/28/2024 1359   GLUCOSE 84 08/28/2024 1359   BUN 12 08/28/2024 1359   CREATININE 0.99 08/28/2024 1359   CALCIUM  9.3 08/28/2024 1359   PROT 7.8 08/28/2024 1359   ALBUMIN 4.5 08/28/2024 1359   AST 27 08/28/2024 1359   ALT 22 08/28/2024 1359   ALKPHOS 81 08/28/2024 1359   BILITOT 0.8 08/28/2024 1359   GFRNONAA >60 08/28/2024 1359   GFRAA >60 10/05/2018 1714     Lab Results  Component Value Date   WBC 6.5 08/28/2024   NEUTROABS 4.5 08/28/2024   HGB 15.0 08/28/2024   HCT 44.5 08/28/2024   MCV 90.3 08/28/2024   PLT 252 08/28/2024     "

## 2024-08-27 NOTE — Assessment & Plan Note (Addendum)
 stage IIIB p(T3, N1b), MMR normal  -presented with intermittent rectal bleeding for 6 months, first colonoscopy on 08/14/21 by Dr. Dianna showed sigmoid colon mass, biopsy confirmed adenocarcinoma. -baseline CEA on 09/12/21 was WNL. -CT CAP on 09/20/21 showed: wall thickening of sigmoid colon; nonspecific but suspicious sigmoid mesentery lymph nodes; two indeterminate tiny hepatic lesions; otherwise no definite metastatic disease.  -left hemicolectomy on 10/19/21 by Dr. Sheldon showed 4 cm adenocarcinoma, grade 2 (moderately differentiated), invasive through muscularis propria into pericolonic tissue; margins negative; metastatic carcinoma in two lymph nodes (2/18).  No lymphovascular or perineural invasion. -liver MRI on 11/24/21 showed benign cysts, no evidence of liver or other metastatic disease. -he completed 4 cycles adjuvant CAPOX 11/23/21 - 01/24/22. He tolerated moderately well with fatigue, nausea, and cold sensitivity.  -Surveillant CT CAP 10/17/2022 showed NED.  -He is clinically doing well, lab reviewed, exam was unremarkable, there is no clinical concern for recurrence. - Surveillance CT CAP to be done at [facility]).  Done on 03/02/2024 showing stable surgical changes and no evidence of recurrence or metastatic disease in the chest, abdomen, or pelvis. - 08/28/2024 -patient clinically doing very well.  Labs reviewed and unremarkable.  Clinical presentation is benign.  Exam is benign.  Will plan for labs and follow-up in 1 year with surveillance CT CAP in late July or early August 2026.

## 2024-08-28 ENCOUNTER — Inpatient Hospital Stay: Attending: Nurse Practitioner

## 2024-08-28 ENCOUNTER — Inpatient Hospital Stay: Admitting: Nurse Practitioner

## 2024-08-28 VITALS — BP 138/103 | HR 88 | Temp 98.3°F | Resp 17 | Wt 244.7 lb

## 2024-08-28 DIAGNOSIS — C187 Malignant neoplasm of sigmoid colon: Secondary | ICD-10-CM | POA: Diagnosis not present

## 2024-08-28 DIAGNOSIS — C7989 Secondary malignant neoplasm of other specified sites: Secondary | ICD-10-CM | POA: Diagnosis not present

## 2024-08-28 DIAGNOSIS — C772 Secondary and unspecified malignant neoplasm of intra-abdominal lymph nodes: Secondary | ICD-10-CM | POA: Insufficient documentation

## 2024-08-28 DIAGNOSIS — C787 Secondary malignant neoplasm of liver and intrahepatic bile duct: Secondary | ICD-10-CM | POA: Insufficient documentation

## 2024-08-28 LAB — CMP (CANCER CENTER ONLY)
ALT: 22 U/L (ref 0–44)
AST: 27 U/L (ref 15–41)
Albumin: 4.5 g/dL (ref 3.5–5.0)
Alkaline Phosphatase: 81 U/L (ref 38–126)
Anion gap: 11 (ref 5–15)
BUN: 12 mg/dL (ref 6–20)
CO2: 26 mmol/L (ref 22–32)
Calcium: 9.3 mg/dL (ref 8.9–10.3)
Chloride: 101 mmol/L (ref 98–111)
Creatinine: 0.99 mg/dL (ref 0.61–1.24)
GFR, Estimated: >60 mL/min
Glucose, Bld: 84 mg/dL (ref 70–99)
Potassium: 4.1 mmol/L (ref 3.5–5.1)
Sodium: 138 mmol/L (ref 135–145)
Total Bilirubin: 0.8 mg/dL (ref 0.0–1.2)
Total Protein: 7.8 g/dL (ref 6.5–8.1)

## 2024-08-28 LAB — CEA (ACCESS): CEA (CHCC): 2.23 ng/mL (ref 0.00–5.00)

## 2024-08-28 LAB — CBC WITH DIFFERENTIAL (CANCER CENTER ONLY)
Abs Immature Granulocytes: 0.03 K/uL (ref 0.00–0.07)
Basophils Absolute: 0 K/uL (ref 0.0–0.1)
Basophils Relative: 1 %
Eosinophils Absolute: 0.3 K/uL (ref 0.0–0.5)
Eosinophils Relative: 4 %
HCT: 44.5 % (ref 39.0–52.0)
Hemoglobin: 15 g/dL (ref 13.0–17.0)
Immature Granulocytes: 1 %
Lymphocytes Relative: 17 %
Lymphs Abs: 1.1 K/uL (ref 0.7–4.0)
MCH: 30.4 pg (ref 26.0–34.0)
MCHC: 33.7 g/dL (ref 30.0–36.0)
MCV: 90.3 fL (ref 80.0–100.0)
Monocytes Absolute: 0.6 K/uL (ref 0.1–1.0)
Monocytes Relative: 10 %
Neutro Abs: 4.5 K/uL (ref 1.7–7.7)
Neutrophils Relative %: 67 %
Platelet Count: 252 K/uL (ref 150–400)
RBC: 4.93 MIL/uL (ref 4.22–5.81)
RDW: 12.7 % (ref 11.5–15.5)
WBC Count: 6.5 K/uL (ref 4.0–10.5)
nRBC: 0 % (ref 0.0–0.2)

## 2024-08-31 ENCOUNTER — Encounter: Payer: Self-pay | Admitting: Nurse Practitioner
